# Patient Record
Sex: Female | Born: 1935 | Race: White | Hispanic: No | Smoking: Never smoker
Health system: Southern US, Community
[De-identification: ages and names within clinical notes are randomized; demographics above are authoritative.]

## PROBLEM LIST (undated history)

## (undated) DIAGNOSIS — N189 Chronic kidney disease, unspecified: Secondary | ICD-10-CM

## (undated) DIAGNOSIS — I10 Essential (primary) hypertension: Secondary | ICD-10-CM

## (undated) DIAGNOSIS — R0602 Shortness of breath: Secondary | ICD-10-CM

## (undated) DIAGNOSIS — K469 Unspecified abdominal hernia without obstruction or gangrene: Secondary | ICD-10-CM

## (undated) DIAGNOSIS — I4891 Unspecified atrial fibrillation: Secondary | ICD-10-CM

## (undated) DIAGNOSIS — F0390 Unspecified dementia without behavioral disturbance: Secondary | ICD-10-CM

## (undated) HISTORY — PX: HERNIA REPAIR: SHX51

## (undated) HISTORY — PX: APPENDECTOMY: SHX54

## (undated) HISTORY — PX: TUBAL LIGATION: SHX77

---

## 1998-09-21 ENCOUNTER — Ambulatory Visit (HOSPITAL_COMMUNITY): Admission: RE | Admit: 1998-09-21 | Discharge: 1998-09-21 | Payer: Self-pay | Admitting: Cardiology

## 2006-08-24 ENCOUNTER — Emergency Department (HOSPITAL_COMMUNITY): Admission: EM | Admit: 2006-08-24 | Discharge: 2006-08-24 | Payer: Self-pay | Admitting: Emergency Medicine

## 2012-06-09 ENCOUNTER — Encounter (HOSPITAL_COMMUNITY): Payer: Self-pay | Admitting: Emergency Medicine

## 2012-06-09 ENCOUNTER — Emergency Department (HOSPITAL_COMMUNITY): Payer: Medicare PPO

## 2012-06-09 ENCOUNTER — Emergency Department (HOSPITAL_COMMUNITY)
Admission: EM | Admit: 2012-06-09 | Discharge: 2012-06-09 | Disposition: A | Discharge status: Home / Self Care | Payer: Medicare PPO | Attending: Emergency Medicine | Admitting: Emergency Medicine

## 2012-06-09 DIAGNOSIS — N390 Urinary tract infection, site not specified: Secondary | ICD-10-CM | POA: Insufficient documentation

## 2012-06-09 DIAGNOSIS — R51 Headache: Secondary | ICD-10-CM | POA: Insufficient documentation

## 2012-06-09 DIAGNOSIS — Z7982 Long term (current) use of aspirin: Secondary | ICD-10-CM | POA: Insufficient documentation

## 2012-06-09 DIAGNOSIS — R42 Dizziness and giddiness: Secondary | ICD-10-CM

## 2012-06-09 LAB — URINALYSIS, ROUTINE W REFLEX MICROSCOPIC
Bilirubin Urine: NEGATIVE
Glucose, UA: NEGATIVE mg/dL
Hgb urine dipstick: NEGATIVE
Ketones, ur: NEGATIVE mg/dL
Leukocytes, UA: NEGATIVE
Nitrite: POSITIVE — AB
Protein, ur: NEGATIVE mg/dL
Specific Gravity, Urine: >1.03 — ABNORMAL HIGH (ref 1.005–1.030)
Urobilinogen, UA: 0.2 mg/dL (ref 0.0–1.0)
pH: 5.5 (ref 5.0–8.0)

## 2012-06-09 LAB — COMPREHENSIVE METABOLIC PANEL WITH GFR
ALT: 13 U/L (ref 0–35)
AST: 22 U/L (ref 0–37)
Albumin: 4 g/dL (ref 3.5–5.2)
Alkaline Phosphatase: 118 U/L — ABNORMAL HIGH (ref 39–117)
BUN: 18 mg/dL (ref 6–23)
CO2: 25 meq/L (ref 19–32)
Calcium: 9.4 mg/dL (ref 8.4–10.5)
Chloride: 101 meq/L (ref 96–112)
Creatinine, Ser: 0.98 mg/dL (ref 0.50–1.10)
GFR calc Af Amer: 60 mL/min — ABNORMAL LOW (ref >90)
GFR calc non Af Amer: 52 mL/min — ABNORMAL LOW (ref >90)
Glucose, Bld: 123 mg/dL — ABNORMAL HIGH (ref 70–99)
Potassium: 3.7 meq/L (ref 3.5–5.1)
Sodium: 138 meq/L (ref 135–145)
Total Bilirubin: 0.4 mg/dL (ref 0.3–1.2)
Total Protein: 7.9 g/dL (ref 6.0–8.3)

## 2012-06-09 LAB — CBC WITH DIFFERENTIAL/PLATELET
Eosinophils Absolute: 0.1 10*3/uL (ref 0.0–0.7)
Eosinophils Relative: 2 % (ref 0–5)
Hemoglobin: 15.5 g/dL — ABNORMAL HIGH (ref 12.0–15.0)
Lymphs Abs: 0.7 10*3/uL (ref 0.7–4.0)
MCH: 31.4 pg (ref 26.0–34.0)
MCV: 94.3 fL (ref 78.0–100.0)
Monocytes Relative: 6 % (ref 3–12)
RBC: 4.94 MIL/uL (ref 3.87–5.11)

## 2012-06-09 MED ORDER — NITROFURANTOIN MACROCRYSTAL 100 MG PO CAPS
100.0000 mg | ORAL_CAPSULE | Freq: Once | ORAL | Status: AC
  Administered 2012-06-09: 100 mg via ORAL
  Filled 2012-06-09: qty 1

## 2012-06-09 MED ORDER — NITROFURANTOIN MONOHYD MACRO 100 MG PO CAPS: 100.0000 mg | ORAL_CAPSULE | Freq: Two times a day (BID) | ORAL | Status: DC

## 2012-06-09 MED ORDER — MECLIZINE HCL 50 MG PO TABS: 25.0000 mg | ORAL_TABLET | Freq: Two times a day (BID) | ORAL | Status: DC | PRN

## 2012-06-09 NOTE — ED Notes (Signed)
Patient does not need anything at this time. 

## 2012-06-09 NOTE — ED Notes (Signed)
Pt reports having cough and congestion for several days, has been dizzy today. Lives w/ her son, stated she is never sick and does not have a pmd. Denies any headache or cp. No nausea or vomiting, normal po intake. resp even and unlabored. Denies any urinary s/s

## 2012-06-09 NOTE — ED Notes (Signed)
States that she started feeling dizzy this morning, states that she had a headache yesterday.

## 2012-06-09 NOTE — ED Provider Notes (Addendum)
History     CSN: [id number]  Arrival date & time 06/09/12  1227   First MD Initiated Contact with Patient 06/09/12 1239      Chief Complaint  Patient presents with  . Dizziness    (Consider location/radiation/quality/duration/timing/severity/associated sxs/prior treatment) HPI....Marland Kitchensense of dizziness this morning with associated headache. Symptoms have since abated. No motor or sensory deficits. No difficulty with speech. Patient is concerned that she might have a urinary tract infection. No evidence of an upper respiratory infection. Severity was mild to moderate. No chest pain, dyspnea, fever, chills, dysuria  History reviewed. No pertinent past medical history.  History reviewed. No pertinent past surgical history.  No family history on file.  History  Substance Use Topics  . Smoking status: Not on file  . Smokeless tobacco: Not on file  . Alcohol Use: Not on file    OB History   Grav Para Term Preterm Abortions TAB SAB Ect Mult Living                  Review of Systems  All other systems reviewed and are negative.    Allergies  Penicillins  Home Medications   Current Outpatient Rx  Name  Route  Sig  Dispense  Refill  . aspirin EC 81 MG tablet   Oral   Take 81 mg by mouth daily.         . bimatoprost (LUMIGAN) 0.01 % SOLN   Both Eyes   Place 1 drop into both eyes at bedtime.         . meclizine (ANTIVERT) 50 MG tablet   Oral   Take 0.5 tablets (25 mg total) by mouth 2 (two) times daily as needed for dizziness.   20 tablet   0   . nitrofurantoin, macrocrystal-monohydrate, (MACROBID) 100 MG capsule   Oral   Take 1 capsule (100 mg total) by mouth 2 (two) times daily. X 7 days   14 capsule   0     BP 147/61  Pulse 67  Temp(Src) 97.8 F (36.6 C) (Oral)  Resp 18  Ht 5\' 2"  (1.575 m)  Wt 160 lb (72.576 kg)  BMI 29.26 kg/m2  SpO2 98%  Physical Exam  Nursing note and vitals reviewed. Constitutional: She is oriented to person, place, and  time. She appears well-developed and well-nourished.  Hard of hearing  HENT:  Head: Normocephalic and atraumatic.  Eyes: Conjunctivae and EOM are normal. Pupils are equal, round, and reactive to light.  Neck: Normal range of motion. Neck supple.  Cardiovascular: Normal rate, regular rhythm and normal heart sounds.   Pulmonary/Chest: Effort normal and breath sounds normal.  Abdominal:  Large ventral hernia which is old  Musculoskeletal: Normal range of motion.  Neurological: She is alert and oriented to person, place, and time.  Skin: Skin is warm and dry.  Psychiatric: She has a normal mood and affect.    ED Course  Procedures (including critical care time)  Labs Reviewed  URINALYSIS, ROUTINE W REFLEX MICROSCOPIC - Abnormal; Notable for the following:    Specific Gravity, Urine >1.030 (*)    Nitrite POSITIVE (*)    All other components within normal limits  COMPREHENSIVE METABOLIC PANEL - Abnormal; Notable for the following:    Glucose, Bld 123 (*)    Alkaline Phosphatase 118 (*)    GFR calc non Af Amer 52 (*)    GFR calc Af Amer 60 (*)    All other components within normal limits  CBC  WITH DIFFERENTIAL - Abnormal; Notable for the following:    Hemoglobin 15.5 (*)    HCT 46.6 (*)    Neutrophils Relative 82 (*)    Lymphocytes Relative 10 (*)    All other components within normal limits  URINE MICROSCOPIC-ADD ON - Abnormal; Notable for the following:    Squamous Epithelial / LPF FEW (*)    Bacteria, UA MANY (*)    All other components within normal limits  URINE CULTURE   Dg Chest 2 View  06/09/2012  *RADIOLOGY REPORT*  Clinical Data: Cough, congestion and dizziness.  CHEST - 2 VIEW  Comparison: 08/24/2006  Findings: Probable component of chronic lung disease/COPD which shows some progression since the prior chest x-ray in 2008. Bronchial thickening and prominence is noted in both lower lung zones.  It would be difficult to exclude early pneumonia.  No edema or pleural fluid  is identified.  The heart is mildly enlarged.  The bony thorax is unremarkable.  IMPRESSION: Some progression of probable chronic lung disease since 2008. Bronchial thickening and prominence may be chronic in both lower lung zones.  It is would be difficult to exclude early pneumonia in either lower lung given current x-ray appearance.   Original Report Authenticated By: Irish Lack, M.D.      1. Vertigo   2. Urinary tract infection       MDM  Patient is feeling much better now. No neuro deficits. Urinalysis shows evidence of infection. Rx Macrobid for one week. Discussed with patient and family. Also Rx for meclizine for vertigo        Donnetta Hutching, MD 06/09/12 1510  Donnetta Hutching, MD 06/11/12 2157

## 2012-06-09 NOTE — ED Notes (Signed)
RN at bedside

## 2012-06-11 LAB — URINE CULTURE: Colony Count: >=100000

## 2012-06-12 ENCOUNTER — Telehealth (HOSPITAL_COMMUNITY): Payer: Self-pay | Admitting: Emergency Medicine

## 2012-06-12 NOTE — ED Notes (Signed)
Patient has +Urine culture. Checking to see if appropriately treated. °

## 2012-06-12 NOTE — ED Notes (Signed)
+  Urine. Patient treated with Macrobid. Sensitive to same. Per protocol MD. °

## 2012-08-21 ENCOUNTER — Other Ambulatory Visit: Payer: Self-pay | Admitting: Ophthalmology

## 2012-08-21 MED ORDER — TETRACAINE HCL 0.5 % OP SOLN: 1.0000 [drp] | OPHTHALMIC | Status: DC

## 2012-08-21 NOTE — H&P (Signed)
History & Physical:   DATE:   08-08-12  NAME:  Tracy [name], Tracy [name]     [id number]       HISTORY OF PRESENT ILLNESS: Chief Eye Complaints glaucoma    Patient states that vision is getting worse interfering with reading & daily activities .  HPI: EYES: Reports symptoms of blurry vision     LOCATION:   right eye   QUALITY/COURSE:   Reports condition is worse   INTENSITY/SEVERITY:    Reports measurement ( or degree) as severe   DURATION:   Reports the general length of symptoms to be months    ONSET/TIMING:   Reports occurrence as constant   CONTEXT/WHEN:   Reports usually associated with daily activites             ROS:   GEN- Constitutional: Negative general-constitutional systems review.      HENT: GEN - Endocrine: Reports symptoms of LUNGS/Respiratory:  HEART/Cardiovascular: Reports symptoms of ABD/Gastrointestinal:   Musculoskeletal (BJE): NEURO/Neurological: PSYCH/Psychiatric:    Is the pt oriented to time, place, person? yes Mood depressed __ normal  agitated __  ACTIVE PROBLEMS: chalazion  OD Primary open angle glaucoma   ICD#365.11  stable Nuclear cataract NOS   ICD#366.04  visually significant Macular degeneration   ICD#362.50  SURGERIES: Pick List - Surgeries  Hernia Surgery 1968  MEDICATIONS: Lumigan: Strength-  SIG-  QHS OU  TOBACCO: Never smoker   ICD#V13.89   none/nothing  SOCIAL HISTORY: Widow.   Retired  FAMILY HISTORY: noncontributory  ALLERGIES PENICILLIN:  PHYSICAL EXAMINATION: [place]     OD:cc 20/200 PH 20/100 OS:cc 20/NLP  EYEGLASSES:  OD:+1.25+0.50x104 OS:+1.25+0.50x081 ADD:+2.50  MR  05/10/2012 13:02 Best corrected OD: +1.25 + 0.50 x 104   20/70 OS:  Balance                  NLP ADD  Auto Refraction 08/08/2012 12:34  OD:PL. +0.75x081 OS:PL. +075 x046  K's Reading:08/08/2012 12:34   OD.46.25 47.00 46.75 OS:45.50 46.25 46.00  VF: OD constricted OS  blind   PUPILS:4 mm normal reaction +2 Marcus Gunn pupil  OS  EYELIDS & OCULAR ADNEXA  RUL nodule  deep set orbit each eye   SLE: Conjunctiva: trace to +1 injection Each eye   Cornea: arc arcus each eye with decreased tear film withu   Anterior Chamber:   deep and quiet Iris: With with with with with gray  Lens: with +3-4 nuclear sclerosis each eye   Ta   in mmHg:     OD: 16 OS: 18 Time: 08/08/2012 13:07      Dilation:     Fundus:  optic nerve:   OD:   pink 45% cup OD                                                OS:   temporal pale inferior & superior rim loss   Macula:       OD:  pig mottling  OS: pig   Vessels: narrow OU  Periphery: normal  H&P B/P:130/64 Pulse:68 Resp.20   Exam: GENERAL: Appearance: General appearance can be described as well-nourished, well-developed, and in no acute distress.    LYMPHATIC: HEAD, EARS, NOSE AND THROAT: Ears-Nose (external) Inspection: Externally, nose and ears are normal in appearance and without scars, lesions, or nodules.          Hearing assessment shows no problems with normal conversation.    Nose exam, internally, reveals nasal mucosa, septum and turbinates are unremarkable.         Oropharynx demonstrates oral mucosa, salivary glands, tongue, tonsils, posterior pharynx, hard-soft palates are normal.  EYES: see above  NECK: Neck tissue exam demonstrates no masses, symmetrical, and trachea is midline.      LUNGS and RESPIRATORY: Lung auscultation elicits no wheezing, rhonci, rales or rubs and with equal breath sounds.    Respiratory effort described as breathing is unlabored and chest movement is symmetrical.    HEART (Cardiovascular): Heart auscultation discovers regular rate and rhythm; no murmur, gallop or rub. Normal heart sounds.    ABDOMEN (Gastrointestinal): Mass/Tenderness Exam: Neither are present.     Liver/Spleen: No hepatomegaly or splenomegaly.   MUSCULOSKELETAL (BJE): Inspection-Palpation: No major bone,  joint, tendon, or muscle changes.      NEUROLOGICAL: Alert and oriented. No major deficits of coordination or sensation.      PSYCHIATRIC: Insight and judgment appear  both to be intact and appropriate.    Mood and affect are described as normal mood and full affect.    SKIN: Skin Inspection: No rashes or lesions.  ADMITTING DIAGNOSIS:Nuclear cataract NOS   ICD#366.04  visually significant OD chalazion  OD Primary open angle glaucoma   ICD#365.11  stable Blind OS  Macular degeneration   ICD#362.50  SURGICAL TREATMENT PLAN: phaco emulsion cataract extraction  with  intraocular lens implant  OD inject steroid OD upper eyelid now Risk and benefits (expected vision improvement) reviewed with patient & she agrees to proceed with surgery.    Actions:    Actions:    ___________________________ Chalmers Guest, Montez Hageman Starter - Inactive Problems:    Hernia

## 2012-08-23 ENCOUNTER — Encounter (HOSPITAL_COMMUNITY): Payer: Self-pay

## 2012-08-23 ENCOUNTER — Encounter (HOSPITAL_COMMUNITY)
Admission: RE | Admit: 2012-08-23 | Discharge: 2012-08-23 | Disposition: A | Discharge status: Home / Self Care | Payer: Medicare PPO | Source: Ambulatory Visit | Attending: Ophthalmology | Admitting: Ophthalmology

## 2012-08-23 HISTORY — DX: Shortness of breath: R06.02

## 2012-08-23 HISTORY — DX: Chronic kidney disease, unspecified: N18.9

## 2012-08-23 LAB — BASIC METABOLIC PANEL
CO2: 26 mEq/L (ref 19–32)
Calcium: 9.2 mg/dL (ref 8.4–10.5)
Chloride: 103 mEq/L (ref 96–112)
GFR calc Af Amer: 55 mL/min — ABNORMAL LOW (ref >90)
Sodium: 139 mEq/L (ref 135–145)

## 2012-08-23 LAB — CBC
HCT: 44.3 % (ref 36.0–46.0)
MCV: 93.7 fL (ref 78.0–100.0)
Platelets: 394 10*3/uL (ref 150–400)
RBC: 4.73 MIL/uL (ref 3.87–5.11)
WBC: 7.1 10*3/uL (ref 4.0–10.5)

## 2012-08-23 NOTE — Pre-Procedure Instructions (Signed)
Tracy [name]  08/23/2012   Your procedure is scheduled on:  Wednesday Aug 28, 2012   Report to [facility] at [phone] AM.  Call this number if you have problems the morning of surgery: [phone]   Remember:   Do not eat food or drink liquids after midnight.   Take these medicines the morning of surgery with A SIP OF WATER: Meclizine(Antivert) if needed   Do not wear jewelry, make-up or nail polish.  Do not wear lotions, powders, or perfumes. You may wear deodorant.  Do not shave 48 hours prior to surgery.   Do not bring valuables to the hospital.  Contacts, dentures or bridgework may not be worn into surgery.  Leave suitcase in the car. After surgery it may be brought to your room.  For patients admitted to the hospital, checkout time is 11:00 AM the day of  discharge.   Patients discharged the day of surgery will not be allowed to drive  home.  Name and phone number of your driver:   Special Instructions: Shower using CHG 2 nights before surgery and the night before surgery.  If you shower the day of surgery use CHG.  Use special wash - you have one bottle of CHG for all showers.  You should use approximately 1/3 of the bottle for each shower.   Please read over the following fact sheets that you were given: Pain Booklet, Coughing and Deep Breathing and Surgical Site Infection Prevention

## 2012-08-28 ENCOUNTER — Encounter (HOSPITAL_COMMUNITY): Payer: Self-pay | Admitting: Certified Registered"

## 2012-08-28 ENCOUNTER — Ambulatory Visit (HOSPITAL_COMMUNITY): Payer: Medicare PPO | Admitting: Certified Registered"

## 2012-08-28 ENCOUNTER — Ambulatory Visit (HOSPITAL_COMMUNITY)
Admission: RE | Admit: 2012-08-28 | Discharge: 2012-08-28 | Disposition: A | Discharge status: Home / Self Care | Payer: Medicare PPO | Source: Ambulatory Visit | Attending: Ophthalmology | Admitting: Ophthalmology

## 2012-08-28 ENCOUNTER — Encounter (HOSPITAL_COMMUNITY)
Admission: RE | Disposition: A | Discharge status: Home / Self Care | Payer: Self-pay | Source: Ambulatory Visit | Attending: Ophthalmology

## 2012-08-28 DIAGNOSIS — H409 Unspecified glaucoma: Secondary | ICD-10-CM | POA: Insufficient documentation

## 2012-08-28 DIAGNOSIS — H2510 Age-related nuclear cataract, unspecified eye: Secondary | ICD-10-CM | POA: Insufficient documentation

## 2012-08-28 DIAGNOSIS — H4011X0 Primary open-angle glaucoma, stage unspecified: Secondary | ICD-10-CM | POA: Insufficient documentation

## 2012-08-28 DIAGNOSIS — H3530 Unspecified macular degeneration: Secondary | ICD-10-CM | POA: Insufficient documentation

## 2012-08-28 HISTORY — PX: CATARACT EXTRACTION W/PHACO: SHX586

## 2012-08-28 SURGERY — PHACOEMULSIFICATION, CATARACT, WITH IOL INSERTION
Anesthesia: Monitor Anesthesia Care | Site: Eye | Laterality: Right | Wound class: Clean

## 2012-08-28 MED ORDER — GATIFLOXACIN 0.5 % OP SOLN
1.0000 [drp] | OPHTHALMIC | Status: AC
  Administered 2012-08-28 (×3): 1 [drp] via OPHTHALMIC
  Filled 2012-08-28: qty 2.5

## 2012-08-28 MED ORDER — ACETYLCHOLINE CHLORIDE 1:100 IO SOLR
INTRAOCULAR | Status: AC
  Filled 2012-08-28: qty 1

## 2012-08-28 MED ORDER — LIDOCAINE HCL 2 % IJ SOLN
INTRAMUSCULAR | Status: DC | PRN
  Administered 2012-08-28: 14:00:00 via RETROBULBAR

## 2012-08-28 MED ORDER — LIDOCAINE HCL 2 % IJ SOLN
INTRAMUSCULAR | Status: AC
  Filled 2012-08-28: qty 20

## 2012-08-28 MED ORDER — DEXAMETHASONE SODIUM PHOSPHATE 10 MG/ML IJ SOLN
INTRAMUSCULAR | Status: AC
  Filled 2012-08-28: qty 1

## 2012-08-28 MED ORDER — TROPICAMIDE 1 % OP SOLN
1.0000 [drp] | OPHTHALMIC | Status: AC
  Administered 2012-08-28 (×3): 1 [drp] via OPHTHALMIC
  Filled 2012-08-28: qty 3

## 2012-08-28 MED ORDER — HYALURONIDASE HUMAN 150 UNIT/ML IJ SOLN
INTRAMUSCULAR | Status: AC
  Filled 2012-08-28: qty 1

## 2012-08-28 MED ORDER — GENTAMICIN SULFATE 40 MG/ML IJ SOLN
INTRAMUSCULAR | Status: AC
  Filled 2012-08-28: qty 2

## 2012-08-28 MED ORDER — SODIUM HYALURONATE 10 MG/ML IO SOLN
INTRAOCULAR | Status: DC | PRN
  Administered 2012-08-28: 0.85 mL via INTRAOCULAR

## 2012-08-28 MED ORDER — ACETYLCHOLINE CHLORIDE 1:100 IO SOLR
INTRAOCULAR | Status: DC | PRN
  Administered 2012-08-28: 20 mg via INTRAOCULAR

## 2012-08-28 MED ORDER — CYCLOPENTOLATE HCL 1 % OP SOLN
1.0000 [drp] | OPHTHALMIC | Status: AC
  Administered 2012-08-28 (×3): 1 [drp] via OPHTHALMIC
  Filled 2012-08-28: qty 2

## 2012-08-28 MED ORDER — TETRACAINE HCL 0.5 % OP SOLN
OPHTHALMIC | Status: DC | PRN
  Administered 2012-08-28: 2 [drp] via OPHTHALMIC

## 2012-08-28 MED ORDER — FENTANYL CITRATE 0.05 MG/ML IJ SOLN: 25.0000 ug | INTRAMUSCULAR | Status: DC | PRN

## 2012-08-28 MED ORDER — SODIUM CHLORIDE 0.9 % IV SOLN
INTRAVENOUS | Status: DC
  Administered 2012-08-28: 12:00:00 via INTRAVENOUS

## 2012-08-28 MED ORDER — LIDOCAINE-EPINEPHRINE 2 %-1:100000 IJ SOLN
INTRAMUSCULAR | Status: AC
  Filled 2012-08-28: qty 1

## 2012-08-28 MED ORDER — TETRACAINE HCL 0.5 % OP SOLN
OPHTHALMIC | Status: AC
  Filled 2012-08-28: qty 2

## 2012-08-28 MED ORDER — OXYCODONE HCL 5 MG/5ML PO SOLN: 5.0000 mg | Freq: Once | ORAL | Status: DC | PRN

## 2012-08-28 MED ORDER — EPINEPHRINE HCL 1 MG/ML IJ SOLN
INTRAOCULAR | Status: DC | PRN
  Administered 2012-08-28: 13:00:00

## 2012-08-28 MED ORDER — PHENYLEPHRINE HCL 2.5 % OP SOLN: 1.0000 [drp] | OPHTHALMIC | Status: DC

## 2012-08-28 MED ORDER — TOBRAMYCIN-DEXAMETHASONE 0.3-0.1 % OP OINT
TOPICAL_OINTMENT | OPHTHALMIC | Status: AC
  Filled 2012-08-28: qty 3.5

## 2012-08-28 MED ORDER — LIDOCAINE HCL (PF) 1 % IJ SOLN
INTRAMUSCULAR | Status: DC | PRN
  Administered 2012-08-28: 5 mL

## 2012-08-28 MED ORDER — PHENYLEPHRINE HCL 2.5 % OP SOLN
1.0000 [drp] | OPHTHALMIC | Status: AC
  Administered 2012-08-28 (×3): 1 [drp] via OPHTHALMIC
  Filled 2012-08-28: qty 3

## 2012-08-28 MED ORDER — KETOROLAC TROMETHAMINE 0.5 % OP SOLN
1.0000 [drp] | OPHTHALMIC | Status: AC
  Administered 2012-08-28 (×3): 1 [drp] via OPHTHALMIC
  Filled 2012-08-28: qty 5

## 2012-08-28 MED ORDER — KETOROLAC TROMETHAMINE 0.5 % OP SOLN: 1.0000 [drp] | OPHTHALMIC | Status: DC

## 2012-08-28 MED ORDER — EPINEPHRINE HCL 1 MG/ML IJ SOLN
INTRAMUSCULAR | Status: AC
  Filled 2012-08-28: qty 1

## 2012-08-28 MED ORDER — PROPOFOL 10 MG/ML IV BOLUS
INTRAVENOUS | Status: DC | PRN
  Administered 2012-08-28: 20 mg via INTRAVENOUS
  Administered 2012-08-28 (×4): 10 mg via INTRAVENOUS
  Administered 2012-08-28: 20 mg via INTRAVENOUS
  Administered 2012-08-28 (×2): 10 mg via INTRAVENOUS
  Administered 2012-08-28 (×2): 20 mg via INTRAVENOUS

## 2012-08-28 MED ORDER — BSS IO SOLN
INTRAOCULAR | Status: AC
  Filled 2012-08-28: qty 500

## 2012-08-28 MED ORDER — BSS IO SOLN
INTRAOCULAR | Status: DC | PRN
  Administered 2012-08-28: 15 mL via INTRAOCULAR

## 2012-08-28 MED ORDER — PILOCARPINE HCL 4 % OP SOLN
OPHTHALMIC | Status: AC
  Filled 2012-08-28: qty 15

## 2012-08-28 MED ORDER — TOBRAMYCIN 0.3 % OP OINT
TOPICAL_OINTMENT | OPHTHALMIC | Status: DC | PRN
  Administered 2012-08-28: 1 via OPHTHALMIC

## 2012-08-28 MED ORDER — CYCLOPENTOLATE HCL 1 % OP SOLN: 1.0000 [drp] | OPHTHALMIC | Status: DC

## 2012-08-28 MED ORDER — BUPIVACAINE HCL (PF) 0.75 % IJ SOLN
INTRAMUSCULAR | Status: AC
  Filled 2012-08-28: qty 10

## 2012-08-28 MED ORDER — NA CHONDROIT SULF-NA HYALURON 40-30 MG/ML IO SOLN
INTRAOCULAR | Status: DC | PRN
  Administered 2012-08-28: 0.5 mL via INTRAOCULAR

## 2012-08-28 MED ORDER — GATIFLOXACIN 0.5 % OP SOLN: 1.0000 [drp] | OPHTHALMIC | Status: DC

## 2012-08-28 MED ORDER — OXYCODONE HCL 5 MG PO TABS: 5.0000 mg | ORAL_TABLET | Freq: Once | ORAL | Status: DC | PRN

## 2012-08-28 MED ORDER — NA CHONDROIT SULF-NA HYALURON 40-30 MG/ML IO SOLN
INTRAOCULAR | Status: AC
  Filled 2012-08-28: qty 0.5

## 2012-08-28 MED ORDER — SODIUM HYALURONATE 10 MG/ML IO SOLN
INTRAOCULAR | Status: AC
  Filled 2012-08-28: qty 0.85

## 2012-08-28 MED ORDER — LIDOCAINE HCL (CARDIAC) 20 MG/ML IV SOLN
INTRAVENOUS | Status: DC | PRN
  Administered 2012-08-28: 40 mg via INTRAVENOUS

## 2012-08-28 SURGICAL SUPPLY — 39 items
APPLICATOR COTTON TIP 6IN STRL (MISCELLANEOUS) ×4 IMPLANT
APPLICATOR DR MATTHEWS STRL (MISCELLANEOUS) ×2 IMPLANT
BAG MINI COLL DRAIN (WOUND CARE) ×2 IMPLANT
BLADE KERATOME 2.75 (BLADE) ×2 IMPLANT
BLADE MINI RND TIP GREEN BEAV (BLADE) IMPLANT
BLADE STAB KNIFE 45DEG (BLADE) IMPLANT
CANNULA ANTERIOR CHAMBER 27GA (MISCELLANEOUS) ×2 IMPLANT
CLOTH BEACON ORANGE TIMEOUT ST (SAFETY) ×2 IMPLANT
CORDS BIPOLAR (ELECTRODE) IMPLANT
DRAPE OPHTHALMIC 40X48 W POUCH (DRAPES) ×2 IMPLANT
DRAPE RETRACTOR (MISCELLANEOUS) ×2 IMPLANT
FILTER BLUE MILLIPORE (MISCELLANEOUS) IMPLANT
GLOVE BIO SURGEON STRL SZ8 (GLOVE) ×2 IMPLANT
GOWN STRL NON-REIN LRG LVL3 (GOWN DISPOSABLE) ×4 IMPLANT
KIT BASIN OR (CUSTOM PROCEDURE TRAY) ×2 IMPLANT
KIT ROOM TURNOVER OR (KITS) ×2 IMPLANT
KNIFE CRESCENT 2.5 55 ANG (BLADE) IMPLANT
LENS IOL ACRSF IQ PC 24.0 (Intraocular Lens) ×1 IMPLANT
LENS IOL ACRYSOF IQ POST 24.0 (Intraocular Lens) ×2 IMPLANT
MASK EYE SHIELD (GAUZE/BANDAGES/DRESSINGS) ×2 IMPLANT
NEEDLE 18GX1X1/2 (RX/OR ONLY) (NEEDLE) ×2 IMPLANT
NEEDLE 25GX 5/8IN NON SAFETY (NEEDLE) ×2 IMPLANT
NEEDLE FILTER BLUNT 18X 1/2SAF (NEEDLE) ×1
NEEDLE FILTER BLUNT 18X1 1/2 (NEEDLE) ×1 IMPLANT
NS IRRIG 1000ML POUR BTL (IV SOLUTION) ×2 IMPLANT
PACK CATARACT CUSTOM (CUSTOM PROCEDURE TRAY) ×2 IMPLANT
PACK CATARACT MCHSCP (PACKS) ×2 IMPLANT
PAD ARMBOARD 7.5X6 YLW CONV (MISCELLANEOUS) ×4 IMPLANT
PAD EYE OVAL STERILE LF (GAUZE/BANDAGES/DRESSINGS) ×2 IMPLANT
PROBE ANTERIOR 20G W/INFUS NDL (MISCELLANEOUS) IMPLANT
SPEAR EYE SURG WECK-CEL (MISCELLANEOUS) IMPLANT
SUT ETHILON 10 0 CS140 6 (SUTURE) ×2 IMPLANT
SUT SILK 4 0 C 3 735G (SUTURE) IMPLANT
SUT SILK 6 0 G 6 (SUTURE) IMPLANT
SUT VICRYL 8 0 TG140 8 (SUTURE) IMPLANT
SYR 3ML LL SCALE MARK (SYRINGE) IMPLANT
SYR TB 1ML LUER SLIP (SYRINGE) ×2 IMPLANT
TOWEL OR 17X24 6PK STRL BLUE (TOWEL DISPOSABLE) ×4 IMPLANT
WATER STERILE IRR 1000ML POUR (IV SOLUTION) ×2 IMPLANT

## 2012-08-28 NOTE — Transfer of Care (Signed)
Immediate Anesthesia Transfer of Care Note  Patient: Tracy [name]  Procedure(s) Performed: Procedure(s): CATARACT EXTRACTION PHACO AND INTRAOCULAR LENS PLACEMENT (IOC) (Right)  Patient Location: Short Stay  Anesthesia Type:MAC  Level of Consciousness: awake, alert  and oriented  Airway & Oxygen Therapy: Patient Spontanous Breathing  Post-op Assessment: Report given to PACU RN and Patient moving all extremities X 4  Post vital signs: Reviewed and stable  Complications: No apparent anesthesia complications

## 2012-08-28 NOTE — Anesthesia Preprocedure Evaluation (Signed)
Anesthesia Evaluation  Patient identified by MRN, date of birth, ID band Patient awake    Reviewed: Allergy & Precautions, H&P , NPO status , Patient's Chart, lab work & pertinent test results  History of Anesthesia Complications Negative for: history of anesthetic complications  Airway Mallampati: II      Dental  (+) Missing, Poor Dentition and Dental Advisory Given   Pulmonary shortness of breath,  breath sounds clear to auscultation        Cardiovascular negative cardio ROS  Rhythm:Regular Rate:Normal + Systolic murmurs    Neuro/Psych Hearing issues    GI/Hepatic negative GI ROS, Neg liver ROS,   Endo/Other  negative endocrine ROS  Renal/GU Renal InsufficiencyRenal disease     Musculoskeletal negative musculoskeletal ROS (+)   Abdominal   Peds  Hematology negative hematology ROS (+)   Anesthesia Other Findings   Reproductive/Obstetrics                           Anesthesia Physical Anesthesia Plan  ASA: II  Anesthesia Plan: MAC   Post-op Pain Management:    Induction: Intravenous  Airway Management Planned: Natural Airway  Additional Equipment:   Intra-op Plan:   Post-operative Plan: Extubation in OR  Informed Consent:   Dental advisory given  Plan Discussed with: CRNA and Surgeon  Anesthesia Plan Comments:         Anesthesia Quick Evaluation

## 2012-08-28 NOTE — H&P (View-Only) (Signed)
                  History & Physical:   DATE:   08-08-12  NAME:  Tracy [name]     [id number]       HISTORY OF PRESENT ILLNESS: Chief Eye Complaints glaucoma    Patient states that vision is getting worse interfering with reading & daily activities .  HPI: EYES: Reports symptoms of blurry vision     LOCATION:   right eye   QUALITY/COURSE:   Reports condition is worse   INTENSITY/SEVERITY:    Reports measurement ( or degree) as severe   DURATION:   Reports the general length of symptoms to be months    ONSET/TIMING:   Reports occurrence as constant   CONTEXT/WHEN:   Reports usually associated with daily activites             ROS:   GEN- Constitutional: Negative general-constitutional systems review.      HENT: GEN - Endocrine: Reports symptoms of LUNGS/Respiratory:  HEART/Cardiovascular: Reports symptoms of ABD/Gastrointestinal:   Musculoskeletal (BJE): NEURO/Neurological: PSYCH/Psychiatric:    Is the pt oriented to time, place, person? yes Mood depressed __ normal  agitated __  ACTIVE PROBLEMS: chalazion  OD Primary open angle glaucoma   ICD#365.11  stable Nuclear cataract NOS   ICD#366.04  visually significant Macular degeneration   ICD#362.50  SURGERIES: Pick List - Surgeries  Hernia Surgery 1966  MEDICATIONS: Lumigan: Strength-  SIG-  QHS OU  TOBACCO: Never smoker   ICD#V13.89   none/nothing  SOCIAL HISTORY: Widow.   Retired  FAMILY HISTORY: noncontributory  ALLERGIES PENICILLIN:  [place]ICAL [place]AMIN[place]: [place]     OD:cc 20/200 PH 20/100 OS:cc 20/NLP  EYEGLASSES:  OD:+1.25+0.50x104 OS:+1.25+0.50x081 ADD:+2.50  MR  05/10/2012 13:02 Best corrected OD: +1.25 + 0.50 x 104   20/70 OS:  Balance                  NLP ADD  Auto Refraction 08/08/2012 12:34  OD:PL. +0.75x081 OS:PL. +075 x046  K's Reading:08/08/2012 12:34   OD.46.25 47.00 46.75 OS:45.50 46.25 46.00  VF: OD constricted OS  blind   PUPILS:4 mm normal reaction +2 Marcus Gunn pupil  OS  EYELIDS & OCULAR ADNEXA  RUL nodule  deep set orbit each eye   SLE: Conjunctiva: trace to +1 injection Each eye   Cornea: arc arcus each eye with decreased tear film withu   Anterior Chamber:   deep and quiet Iris: With with with with with gray  Lens: with +3-4 nuclear sclerosis each eye   Ta   in mmHg:     OD: 16 OS: 18 Time: 08/08/2012 13:07      Dilation:     Fundus:  optic nerve:   OD:   pink 45% cup OD                                                OS:   temporal pale inferior & superior rim loss   Macula:       OD:  pig mottling                                                      OS: pig   Vessels: narrow OU  Periphery: normal  H&P B/P:130/64 Pulse:68 Resp.20   Exam: GENERAL: Appearance: General appearance can be described as well-nourished, well-developed, and in no acute distress.    LYMPHATIC: HEAD, EARS, NOSE AND THROAT: Ears-Nose (external) Inspection: Externally, nose and ears are normal in appearance and without scars, lesions, or nodules.          Hearing assessment shows no problems with normal conversation.    Nose exam, internally, reveals nasal mucosa, septum and turbinates are unremarkable.         Oropharynx demonstrates oral mucosa, salivary glands, tongue, tonsils, posterior pharynx, hard-soft palates are normal.  EYES: see above  NECK: Neck tissue exam demonstrates no masses, symmetrical, and trachea is midline.      LUNGS and RESPIRATORY: Lung auscultation elicits no wheezing, rhonci, rales or rubs and with equal breath sounds.    Respiratory effort described as breathing is unlabored and chest movement is symmetrical.    HEART (Cardiovascular): Heart auscultation discovers regular rate and rhythm; no murmur, gallop or rub. Normal heart sounds.    ABDOMEN (Gastrointestinal): Mass/Tenderness Exam: Neither are present.     Liver/Spleen: No hepatomegaly or splenomegaly.   MUSCULOSKELETAL (BJE): Inspection-Palpation: No major bone,  joint, tendon, or muscle changes.      NEUROLOGICAL: Alert and oriented. No major deficits of coordination or sensation.      PSYCHIATRIC: Insight and judgment appear  both to be intact and appropriate.    Mood and affect are described as normal mood and full affect.    SKIN: Skin Inspection: No rashes or lesions.  ADMITTING DIAGNOSIS:Nuclear cataract NOS   ICD#366.04  visually significant OD chalazion  OD Primary open angle glaucoma   ICD#365.11  stable Blind OS  Macular degeneration   ICD#362.50  SURGICAL TREATMENT PLAN: phaco emulsion cataract extraction  with  intraocular lens implant  OD inject steroid OD upper eyelid now Risk and benefits (expected vision improvement) reviewed with patient & she agrees to proceed with surgery.    Actions:    Actions:    ___________________________ Jenevieve Kirschbaum, Jr. Starter - Inactive Problems:    Hernia  

## 2012-08-28 NOTE — Preoperative (Signed)
Beta Blockers   Reason not to administer Beta Blockers:Not Applicable 

## 2012-08-28 NOTE — Anesthesia Postprocedure Evaluation (Signed)
  Anesthesia Post-op Note  Patient: Tracy [name]  Procedure(s) Performed: Procedure(s): CATARACT EXTRACTION PHACO AND INTRAOCULAR LENS PLACEMENT (IOC) (Right)  Patient Location: Short Stay  Anesthesia Type:MAC  Level of Consciousness: awake, alert  and oriented  Airway and Oxygen Therapy: Patient Spontanous Breathing  Post-op Pain: none  Post-op Assessment: Post-op Vital signs reviewed  Post-op Vital Signs: Reviewed and stable  Complications: No apparent anesthesia complications

## 2012-08-28 NOTE — Op Note (Signed)
Preoperative diagnosis: Visually significant cataract in patient's only remaining right eye and glaucoma right eye.  Postoperative diagnosis: Same Procedure: Phacoemulsification with intraocular lens implant Complications: None Anesthesia: 2% Xylocaine with Marcaine and tetracaine topical, intracameral 1% Xylocaine preservative-free Topical anesthetics were applied to the patient's eye and the patient's face was prepped and draped in the usual sterile fashion. With the surgeon sitting temporally and the operating microscope in position. Weck-Cel sponges used to fixate the globe it was noted that the orbit was very deep set with a very prominent brow making exposure to the eye very difficult. Using a Weck-Cel sponge a 15 blade was used to enter the anterior chamber and Viscoat was injected in the eye prior to injecting Viscoat 1% Xylocaine preservative-free was also injected into the anterior chamber. Following this an additional Weck-Cel sponges used to fixate the globe and a 2.5 mm keratome blade was used through temporal clear cornea to into the anterior chamber and a stepwise fashion. Additional Viscoat was injected into the eye. Using a bent 25-gauge needle the anterior capsule was incised and a continuous tear curvilinear capsulorrhexis was formed the Utrata forceps was used correction the Utrata forceps were bent not able to be use therefore angle Kellan forceps was used to remove the anterior capsule. Following this BSS was used to hydrodissect and hydrodelineate the nucleus the nucleus was then rotated in the capsular bag. Following this the phacoemulsification unit was then used to sculpt the epinucleus the nucleus was sculpt deeply centrally a Kuglen hook was used to support the nucleus and then snapped the nucleus into 4 quadrants by rotating the nucleus and use and it gets the phacotip. Following this the phaco emulsification unit was then used to remove the 4 nuclear fragments using a pulse mode  after all nuclear fragments had been removed from the eye the posterior capsule remained intact the epinucleus was present. The epinucleus was removed using the irrigation aspiration handpiece. Following this the cortical fibers were stripped from the posterior capsule all cortical fibers were removed except subincisional cortex and the posterior capsule remained intact. At this point Provisc was injected into the eye the intraocular lens implant was examined and noted to have no defects the lens was an Alcon AcrySof SN 60 WF 24.0 diopter lens SN #[id number] the lens is placed in the lens injector the injector was placed through the incision the lens was injected and unfolded in the eye in position with a Kuglen hook following this the irrigation aspiration handpiece was then used to remove subincisional cortex and viscoelastic from the eye. Miostat was injected in the eye the pupil was noted to be round a single 10-0 nylon suture was placed and the suture was buried the eye was pressurized with balanced salt solution and there was no leakage. Therefore all instruments including the speculum were removed from the eye Topical TobraDex ointment was applied to the eye. A plastic shield with holes was placed over the eye since this is the patient's only seeing eye. The patient returned to recovery area in stable condition

## 2012-08-28 NOTE — Interval H&P Note (Signed)
History and Physical Interval Note:  08/28/2012 12:44 PM  Tracy [name]  has presented today for surgery, with the diagnosis of CATARACT  The various methods of treatment have been discussed with the patient and family. After consideration of risks, benefits and other options for treatment, the patient has consented to  Procedure(s): CATARACT EXTRACTION PHACO AND INTRAOCULAR LENS PLACEMENT (IOC) (Right) as a surgical intervention .  The patient's history has been reviewed, patient examined, no change in status, stable for surgery.  I have reviewed the patient's chart and labs.  Questions were answered to the patient's satisfaction.     Shanan Fitzpatrick

## 2012-08-30 ENCOUNTER — Encounter (HOSPITAL_COMMUNITY): Payer: Self-pay | Admitting: Ophthalmology

## 2013-03-25 ENCOUNTER — Encounter (HOSPITAL_COMMUNITY): Payer: Self-pay | Admitting: Emergency Medicine

## 2013-03-25 ENCOUNTER — Emergency Department (HOSPITAL_COMMUNITY): Payer: Medicare PPO

## 2013-03-25 ENCOUNTER — Emergency Department (HOSPITAL_COMMUNITY)
Admission: EM | Admit: 2013-03-25 | Discharge: 2013-03-25 | Disposition: A | Discharge status: Home / Self Care | Payer: Medicare PPO | Attending: Emergency Medicine | Admitting: Emergency Medicine

## 2013-03-25 DIAGNOSIS — J209 Acute bronchitis, unspecified: Secondary | ICD-10-CM | POA: Insufficient documentation

## 2013-03-25 DIAGNOSIS — N189 Chronic kidney disease, unspecified: Secondary | ICD-10-CM | POA: Insufficient documentation

## 2013-03-25 DIAGNOSIS — N390 Urinary tract infection, site not specified: Secondary | ICD-10-CM | POA: Insufficient documentation

## 2013-03-25 DIAGNOSIS — J40 Bronchitis, not specified as acute or chronic: Secondary | ICD-10-CM

## 2013-03-25 DIAGNOSIS — R609 Edema, unspecified: Secondary | ICD-10-CM | POA: Insufficient documentation

## 2013-03-25 DIAGNOSIS — Z880 Allergy status to penicillin: Secondary | ICD-10-CM | POA: Insufficient documentation

## 2013-03-25 DIAGNOSIS — Z7982 Long term (current) use of aspirin: Secondary | ICD-10-CM | POA: Insufficient documentation

## 2013-03-25 LAB — TROPONIN I: Troponin I: <0.3 ng/mL (ref <0.30)

## 2013-03-25 LAB — CBC WITH DIFFERENTIAL/PLATELET
HCT: 48.7 % — ABNORMAL HIGH (ref 36.0–46.0)
Hemoglobin: 16.3 g/dL — ABNORMAL HIGH (ref 12.0–15.0)
Lymphocytes Relative: 7 % — ABNORMAL LOW (ref 12–46)
Lymphs Abs: 0.7 10*3/uL (ref 0.7–4.0)
MCHC: 33.5 g/dL (ref 30.0–36.0)
Monocytes Absolute: 0.9 10*3/uL (ref 0.1–1.0)
Monocytes Relative: 9 % (ref 3–12)
Neutro Abs: 7.8 10*3/uL — ABNORMAL HIGH (ref 1.7–7.7)
RBC: 5.14 MIL/uL — ABNORMAL HIGH (ref 3.87–5.11)
WBC: 9.7 10*3/uL (ref 4.0–10.5)

## 2013-03-25 LAB — COMPREHENSIVE METABOLIC PANEL
Alkaline Phosphatase: 117 U/L (ref 39–117)
BUN: 11 mg/dL (ref 6–23)
CO2: 27 mEq/L (ref 19–32)
Chloride: 98 mEq/L (ref 96–112)
Creatinine, Ser: 0.89 mg/dL (ref 0.50–1.10)
GFR calc non Af Amer: 58 mL/min — ABNORMAL LOW (ref >90)
Glucose, Bld: 100 mg/dL — ABNORMAL HIGH (ref 70–99)
Total Bilirubin: 0.7 mg/dL (ref 0.3–1.2)

## 2013-03-25 LAB — URINALYSIS, ROUTINE W REFLEX MICROSCOPIC
Bilirubin Urine: NEGATIVE
Hgb urine dipstick: NEGATIVE
Nitrite: NEGATIVE
Urobilinogen, UA: 1 mg/dL (ref 0.0–1.0)
pH: 6 (ref 5.0–8.0)

## 2013-03-25 LAB — PRO B NATRIURETIC PEPTIDE: Pro B Natriuretic peptide (BNP): 732.9 pg/mL — ABNORMAL HIGH (ref 0–450)

## 2013-03-25 LAB — URINE MICROSCOPIC-ADD ON

## 2013-03-25 MED ORDER — PREDNISONE 20 MG PO TABS: ORAL_TABLET | ORAL | Status: DC

## 2013-03-25 MED ORDER — ALBUTEROL SULFATE (5 MG/ML) 0.5% IN NEBU
2.5000 mg | INHALATION_SOLUTION | Freq: Once | RESPIRATORY_TRACT | Status: AC
  Administered 2013-03-25: 2.5 mg via RESPIRATORY_TRACT
  Filled 2013-03-25: qty 0.5

## 2013-03-25 MED ORDER — ALBUTEROL SULFATE HFA 108 (90 BASE) MCG/ACT IN AERS
2.0000 | INHALATION_SPRAY | RESPIRATORY_TRACT | Status: DC | PRN
  Filled 2013-03-25: qty 6.7

## 2013-03-25 MED ORDER — ALBUTEROL SULFATE HFA 108 (90 BASE) MCG/ACT IN AERS: 2.0000 | INHALATION_SPRAY | Freq: Once | RESPIRATORY_TRACT | Status: DC

## 2013-03-25 MED ORDER — CEPHALEXIN 500 MG PO CAPS: 500.0000 mg | ORAL_CAPSULE | Freq: Four times a day (QID) | ORAL | Status: DC

## 2013-03-25 NOTE — ED Notes (Signed)
RT notified of breathing treatment.

## 2013-03-25 NOTE — ED Notes (Signed)
This RN is now caring for this patient at this time. Introduced myself to patient and family, and also assessed patient. Patient complaining of nonproductive cough. Patient in NAD. Will continue to monitor patient.

## 2013-03-25 NOTE — ED Notes (Signed)
Pt went to Urgent Care because she thought she had a "cold".  Sent here  To eval for possible chf   CXR was sent with her.  Alert,   Had flank pain in last few days ,No pain now

## 2013-03-25 NOTE — ED Provider Notes (Signed)
CSN: [id number]     Arrival date & time 03/25/13  1418 History   First MD Initiated Contact with Patient 03/25/13 1611     Chief Complaint  Patient presents with  . Shortness of Breath   (Consider location/radiation/quality/duration/timing/severity/associated sxs/prior Treatment) HPI Comments: Patient presents to the ER after being seen at urgent care. Patient reports that she started having a runny nose and cough yesterday. Today she started having some wheezing and difficulty breathing. She was seen at urgent care, had an x-ray and was told to come to the emergency department evaluated for possible congestive heart failure. Patient denies any previous history of congestive heart failure. She denies chest pain.  Patient is a 79 y.o. female presenting with shortness of breath.  Shortness of Breath Associated symptoms: cough and wheezing   Associated symptoms: no chest pain     Past Medical History  Diagnosis Date  . Shortness of breath   . Chronic kidney disease     hx kidney infection   Past Surgical History  Procedure Laterality Date  . Hernia repair      umbilical  . Appendectomy    . Cataract extraction w/phaco Right 08/28/2012    Procedure: CATARACT EXTRACTION PHACO AND INTRAOCULAR LENS PLACEMENT (IOC);  Surgeon: Chalmers Guest, MD;  Location: [facility] OR;  Service: Ophthalmology;  Laterality: Right;   History reviewed. No pertinent family history. History  Substance Use Topics  . Smoking status: Never Smoker   . Smokeless tobacco: Never Used  . Alcohol Use: No   OB History   Grav Para Term Preterm Abortions TAB SAB Ect Mult Living                 Review of Systems  HENT: Positive for rhinorrhea.   Respiratory: Positive for cough, shortness of breath and wheezing.   Cardiovascular: Negative for chest pain and leg swelling.  All other systems reviewed and are negative.    Allergies  Tylenol with codeine #3 and Penicillins  Home Medications   Current Outpatient Rx   Name  Route  Sig  Dispense  Refill  . aspirin EC 81 MG tablet   Oral   Take 81 mg by mouth daily.         . bimatoprost (LUMIGAN) 0.01 % SOLN   Both Eyes   Place 1 drop into both eyes at bedtime.          BP 162/82  Pulse 76  Temp(Src) 97.8 F (36.6 C) (Oral)  Resp 22  SpO2 97% Physical Exam  Constitutional: She is oriented to person, place, and time. She appears well-developed and well-nourished. No distress.  HENT:  Head: Normocephalic and atraumatic.  Right Ear: Hearing normal.  Left Ear: Hearing normal.  Nose: Nose normal.  Mouth/Throat: Oropharynx is clear and moist and mucous membranes are normal.  Eyes: Conjunctivae and EOM are normal. Pupils are equal, round, and reactive to light.  Neck: Normal range of motion. Neck supple.  Cardiovascular: Regular rhythm, S1 normal and S2 normal.  Exam reveals no gallop and no friction rub.   No murmur heard. Pulmonary/Chest: Effort normal and breath sounds normal. No respiratory distress. She exhibits no tenderness.  Abdominal: Soft. Normal appearance and bowel sounds are normal. There is no hepatosplenomegaly. There is no tenderness. There is no rebound, no guarding, no tenderness at McBurney's point and negative Murphy's sign. No hernia.  Musculoskeletal: Normal range of motion. She exhibits edema (bilateral pedakl edema).  Neurological: She is alert and oriented to  person, place, and time. She has normal strength. No cranial nerve deficit or sensory deficit. Coordination normal. GCS eye subscore is 4. GCS verbal subscore is 5. GCS motor subscore is 6.  Skin: Skin is warm, dry and intact. No rash noted. No cyanosis.  Psychiatric: She has a normal mood and affect. Her speech is normal and behavior is normal. Thought content normal.    ED Course  Procedures (including critical care time) Labs Review Labs Reviewed  CBC WITH DIFFERENTIAL - Abnormal; Notable for the following:    RBC 5.14 (*)    Hemoglobin 16.3 (*)    HCT 48.7  (*)    Neutrophils Relative % 81 (*)    Neutro Abs 7.8 (*)    Lymphocytes Relative 7 (*)    All other components within normal limits  COMPREHENSIVE METABOLIC PANEL - Abnormal; Notable for the following:    Glucose, Bld 100 (*)    GFR calc non Af Amer 58 (*)    GFR calc Af Amer 67 (*)    All other components within normal limits  PRO B NATRIURETIC PEPTIDE - Abnormal; Notable for the following:    Pro B Natriuretic peptide (BNP) 732.9 (*)    All other components within normal limits  URINALYSIS, ROUTINE W REFLEX MICROSCOPIC - Abnormal; Notable for the following:    Ketones, ur TRACE (*)    Leukocytes, UA SMALL (*)    All other components within normal limits  URINE MICROSCOPIC-ADD ON - Abnormal; Notable for the following:    Squamous Epithelial / LPF FEW (*)    Bacteria, UA FEW (*)    All other components within normal limits  URINE CULTURE  TROPONIN I   Imaging Review Dg Chest 2 View  03/25/2013   CLINICAL DATA:  Shortness of breath.  EXAM: CHEST  2 VIEW  COMPARISON:  08/23/2012  FINDINGS: The cardiac silhouette is upper limits of normal in size, unchanged. The lungs remain hyperinflated. Mild, diffuse prominence of the interstitium throughout both lungs is unchanged. There is no evidence of focal airspace consolidation, edema, pleural effusion, or pneumothorax. No acute osseous abnormality is identified.  IMPRESSION: Stable appearance of the chest without evidence of acute airspace disease.   Electronically Signed   By: Sebastian Ache   On: 03/25/2013 17:19    EKG Interpretation    Date/Time:  Tuesday March 25 2013 16:38:11 EST Ventricular Rate:  63 PR Interval:  190 QRS Duration: 84 QT Interval:  412 QTC Calculation: 421 R Axis:   85 Text Interpretation:  Normal sinus rhythm Possible Left atrial enlargement Borderline ECG When compared with ECG of 09-Jun-2012 12:36, Vent. rate has decreased BY  31 BPM T wave inversion no longer evident in Inferior leads Confirmed by POLLINA   MD, CHRISTOPHER ([id number]) on 03/25/2013 5:33:45 PM            MDM  Diagnosis: Acute bronchitis; UTI  Patient presents to the ER for evaluation at the request of urgent care. Patient has been sick for 2 days with upper respiratory infection symptoms. She also has had some back and flank discomfort. Urticaria became concerned about the possibility of congestive heart failure, although it is not clear why they felt this. His workup here reveals no evidence of acute pulmonary edema or congestive heart failure. She had bronchospasm present on initial evaluation which improved with albuterol. Patient will therefore be treated for acute bronchitis with continued bronchodilators, prednisone, antibiotic coverage. In addition she has evidence of urinary tract infection  which will be covered with antibiotics.    Gilda Crease, MD 03/25/13 [phone]

## 2013-03-25 NOTE — ED Notes (Signed)
Advised patient we needed urine specimen.  Unable to go to bathroom at this time.

## 2013-03-27 LAB — URINE CULTURE: Colony Count: >=100000

## 2013-03-28 ENCOUNTER — Telehealth (HOSPITAL_COMMUNITY): Payer: Self-pay | Admitting: Emergency Medicine

## 2013-03-28 NOTE — ED Notes (Signed)
Post ED Visit - Positive Culture Follow-up  Culture report reviewed by antimicrobial stewardship pharmacist: []  Wes Dulaney, Pharm.D., BCPS [x]  Celedonio Miyamoto, [address].D., BCPS []  Georgina Pillion, [address].D., BCPS []  [place], [address].D., BCPS, AAHIVP []  Estella Husk, Pharm.D., BCPS, AAHIVP  Positive urine culture Treated with Keflex, organism sensitive to the same and no further patient follow-up is required at this time.  Kylie A Holland 03/28/2013, 11:10 AM

## 2013-04-03 ENCOUNTER — Encounter (HOSPITAL_COMMUNITY): Payer: Self-pay | Admitting: Emergency Medicine

## 2013-04-03 ENCOUNTER — Emergency Department (HOSPITAL_COMMUNITY)
Admission: EM | Admit: 2013-04-03 | Discharge: 2013-04-03 | Disposition: A | Discharge status: Home / Self Care | Payer: Medicare PPO | Attending: Emergency Medicine | Admitting: Emergency Medicine

## 2013-04-03 ENCOUNTER — Emergency Department (HOSPITAL_COMMUNITY): Payer: Medicare PPO

## 2013-04-03 DIAGNOSIS — Z792 Long term (current) use of antibiotics: Secondary | ICD-10-CM | POA: Insufficient documentation

## 2013-04-03 DIAGNOSIS — Z7982 Long term (current) use of aspirin: Secondary | ICD-10-CM | POA: Insufficient documentation

## 2013-04-03 DIAGNOSIS — Z880 Allergy status to penicillin: Secondary | ICD-10-CM | POA: Insufficient documentation

## 2013-04-03 DIAGNOSIS — N189 Chronic kidney disease, unspecified: Secondary | ICD-10-CM | POA: Insufficient documentation

## 2013-04-03 DIAGNOSIS — Z9089 Acquired absence of other organs: Secondary | ICD-10-CM | POA: Insufficient documentation

## 2013-04-03 DIAGNOSIS — R05 Cough: Secondary | ICD-10-CM

## 2013-04-03 DIAGNOSIS — IMO0002 Reserved for concepts with insufficient information to code with codable children: Secondary | ICD-10-CM | POA: Insufficient documentation

## 2013-04-03 DIAGNOSIS — R059 Cough, unspecified: Secondary | ICD-10-CM | POA: Insufficient documentation

## 2013-04-03 DIAGNOSIS — Z79899 Other long term (current) drug therapy: Secondary | ICD-10-CM | POA: Insufficient documentation

## 2013-04-03 MED ORDER — BENZONATATE 100 MG PO CAPS: 100.0000 mg | ORAL_CAPSULE | Freq: Three times a day (TID) | ORAL | Status: DC | PRN

## 2013-04-03 NOTE — ED Provider Notes (Signed)
CSN: [id number]     Arrival date & time 04/03/13  1749 History   First MD Initiated Contact with Patient 04/03/13 1818 This chart was scribed for Nelia Shi, MD by Valera Castle, ED Scribe. This patient was seen in room APA18/APA18 and the patient's care was started at 7:07 PM.      Chief Complaint  Patient presents with  . Flank Pain   The history is provided by the patient and the spouse. No language interpreter was used.   HPI Comments: Tracy [name] is a 79 y.o. female brought in with her husband who presents to the Emergency Department complaining of recurrent, moderate, constant, right flank pain, that radiates around to her lower back and neck. She reports an associated deep cough, productive of clear sputum, that makes her stomach hurt. She reports she still has her inhaler, but denies using it due to fear that it was no good after leaving it outside in the cold. She reports being seen here before for bronchitis and similar flank pain. She states she has had some bowel movements. She reports sleeping well in the evenings, especially last night. She reports taking all her medication regularly. She denies fever, and any other associated symptoms. Pt has h/o hernia, appendectomy, and chronic kidney disease.   PCP - No PCP Per Patient  Past Medical History  Diagnosis Date  . Shortness of breath   . Chronic kidney disease     hx kidney infection   Past Surgical History  Procedure Laterality Date  . Hernia repair      umbilical  . Appendectomy    . Cataract extraction w/phaco Right 08/28/2012    Procedure: CATARACT EXTRACTION PHACO AND INTRAOCULAR LENS PLACEMENT (IOC);  Surgeon: Chalmers Guest, MD;  Location: [facility] OR;  Service: Ophthalmology;  Laterality: Right;   History reviewed. No pertinent family history. History  Substance Use Topics  . Smoking status: Never Smoker   . Smokeless tobacco: Never Used  . Alcohol Use: No   OB History   Grav Para Term Preterm Abortions TAB  SAB Ect Mult Living                 Review of Systems A complete 10 system review of systems was obtained and all systems are negative except as noted in the HPI and PMH.   Allergies  Tylenol with codeine #3 and Penicillins  Home Medications   Current Outpatient Rx  Name  Route  Sig  Dispense  Refill  . aspirin EC 81 MG tablet   Oral   Take 81 mg by mouth daily.         . benzonatate (TESSALON PERLES) 100 MG capsule   Oral   Take 1 capsule (100 mg total) by mouth 3 (three) times daily as needed for cough.   20 capsule   0   . bimatoprost (LUMIGAN) 0.01 % SOLN   Both Eyes   Place 1 drop into both eyes at bedtime.         . Bromfenac Sodium (PROLENSA) 0.07 % SOLN   Ophthalmic   Apply 1 drop to eye daily.         . cephALEXin (KEFLEX) 500 MG capsule   Oral   Take 1 capsule (500 mg total) by mouth 4 (four) times daily.   28 capsule   0   . predniSONE (DELTASONE) 20 MG tablet      3 tabs po day one, then 2 tabs daily x 4 days  11 tablet   0    BP 148/55  Pulse 66  Temp(Src) 98 F (36.7 C) (Oral)  Resp 18  Ht 5' (1.524 m)  Wt 147 lb (66.679 kg)  BMI 28.71 kg/m2  SpO2 98%  Physical Exam  Nursing note and vitals reviewed. Constitutional: She is oriented to person, place, and time. She appears well-developed and well-nourished. No distress.  HENT:  Head: Normocephalic and atraumatic.  Eyes: Pupils are equal, round, and reactive to light.  Neck: Normal range of motion.  Cardiovascular: Normal rate and intact distal pulses.   Pulmonary/Chest: No respiratory distress.  Abdominal: Normal appearance. She exhibits no distension.    Musculoskeletal: Normal range of motion.  Neurological: She is alert and oriented to person, place, and time. No cranial nerve deficit.  Skin: Skin is warm and dry. No rash noted.  Psychiatric: She has a normal mood and affect. Her behavior is normal.    ED Course  Procedures (including critical care time)  DIAGNOSTIC  STUDIES: Oxygen Saturation is 98% on room air, normal by my interpretation.    COORDINATION OF CARE: 7:13 PM-Discussed treatment plan with pt at bedside and pt agreed to plan.   Labs Review Labs Reviewed - No data to display Imaging Review Dg Chest 2 View  04/03/2013   CLINICAL DATA:  Cough and shortness of breath. Chronic kidney disease.  EXAM: CHEST  2 VIEW  COMPARISON:  03/25/2013  FINDINGS: Mild to moderate cardiomegaly is stable. Changes of COPD are also unchanged. No evidence of acute or superimposed infiltrate. No evidence of pulmonary edema. No evidence of pleural effusion. No mass or lymphadenopathy identified.  IMPRESSION: Stable cardiomegaly and COPD.  No acute findings.   Electronically Signed   By: Myles Rosenthal M.D.   On: 04/03/2013 19:45    EKG Interpretation   None      Meds ordered this encounter  Medications  . benzonatate (TESSALON PERLES) 100 MG capsule    Sig: Take 1 capsule (100 mg total) by mouth 3 (three) times daily as needed for cough.    Dispense:  20 capsule    Refill:  0    MDM   1. Cough       I personally performed the services described in this documentation, which was scribed in my presence. The recorded information has been reviewed and considered.    Nelia Shi, MD 04/03/13 [phone]

## 2013-04-03 NOTE — ED Notes (Signed)
Pain rt flank, seen  Here recently for similar sx   Pain goes around her back and and neck.

## 2013-04-26 ENCOUNTER — Emergency Department (HOSPITAL_COMMUNITY)
Admission: EM | Admit: 2013-04-26 | Discharge: 2013-04-26 | Disposition: A | Discharge status: Home / Self Care | Payer: Medicare PPO | Attending: Emergency Medicine | Admitting: Emergency Medicine

## 2013-04-26 ENCOUNTER — Encounter (HOSPITAL_COMMUNITY): Payer: Self-pay | Admitting: Emergency Medicine

## 2013-04-26 ENCOUNTER — Emergency Department (HOSPITAL_COMMUNITY): Payer: Medicare PPO

## 2013-04-26 DIAGNOSIS — Y929 Unspecified place or not applicable: Secondary | ICD-10-CM | POA: Insufficient documentation

## 2013-04-26 DIAGNOSIS — Z9089 Acquired absence of other organs: Secondary | ICD-10-CM | POA: Insufficient documentation

## 2013-04-26 DIAGNOSIS — W19XXXA Unspecified fall, initial encounter: Secondary | ICD-10-CM

## 2013-04-26 DIAGNOSIS — N189 Chronic kidney disease, unspecified: Secondary | ICD-10-CM | POA: Insufficient documentation

## 2013-04-26 DIAGNOSIS — S3981XA Other specified injuries of abdomen, initial encounter: Secondary | ICD-10-CM | POA: Insufficient documentation

## 2013-04-26 DIAGNOSIS — R109 Unspecified abdominal pain: Secondary | ICD-10-CM

## 2013-04-26 DIAGNOSIS — R296 Repeated falls: Secondary | ICD-10-CM | POA: Insufficient documentation

## 2013-04-26 DIAGNOSIS — K439 Ventral hernia without obstruction or gangrene: Secondary | ICD-10-CM | POA: Insufficient documentation

## 2013-04-26 DIAGNOSIS — Z9851 Tubal ligation status: Secondary | ICD-10-CM | POA: Insufficient documentation

## 2013-04-26 DIAGNOSIS — Z880 Allergy status to penicillin: Secondary | ICD-10-CM | POA: Insufficient documentation

## 2013-04-26 DIAGNOSIS — J90 Pleural effusion, not elsewhere classified: Secondary | ICD-10-CM | POA: Insufficient documentation

## 2013-04-26 DIAGNOSIS — Y9389 Activity, other specified: Secondary | ICD-10-CM | POA: Insufficient documentation

## 2013-04-26 NOTE — ED Provider Notes (Signed)
CSN: [id number]     Arrival date & time 04/26/13  1444 History   First MD Initiated Contact with Patient 04/26/13 1727     This chart was scribed for Donnetta HutchingBrian Haile Bosler, MD by Arlan OrganAshley Leger, ED Scribe. This patient was seen in room APA01/APA01 and the patient's care was started 6:16 PM.   Chief Complaint  Patient presents with  . Fall   The history is provided by the patient and a relative. No language interpreter was used.    HPI Comments: Tracy [name] is a 80 y.o. female who presents to the Emergency Department complaining of a fall that occurred a few hours prior to arrival. Pt states she went to sit down, missed the couch, and landed on the ground. She now c/o gradual onset, constant, moderate pain to her RLQ. She states the pain is worsened when she stands up and with forceful breathing. Pt states she was able to ambulate without difficulty after the time of the fall. She states she has been eating normally. She denies dysuria or SOB.    Pt states she does not currently have a PCP Past Medical History  Diagnosis Date  . Shortness of breath   . Chronic kidney disease     hx kidney infection   Past Surgical History  Procedure Laterality Date  . Hernia repair      umbilical  . Appendectomy    . Cataract extraction w/phaco Right 08/28/2012    Procedure: CATARACT EXTRACTION PHACO AND INTRAOCULAR LENS PLACEMENT (IOC);  Surgeon: Chalmers Guestoy Whitaker, MD;  [facility];  Service: Ophthalmology;  Laterality: Right;  . Tubal ligation     History reviewed. No pertinent family history. History  Substance Use Topics  . Smoking status: Never Smoker   . Smokeless tobacco: Never Used  . Alcohol Use: No   OB History   Grav Para Term Preterm Abortions TAB SAB Ect Mult Living                 Review of Systems  A complete 10 system review of systems was obtained and all systems are negative except as noted in the HPI and PMH.    Allergies  Penicillins and Tylenol with codeine #3  Home  Medications   Current Outpatient Rx  Name  Route  Sig  Dispense  Refill  . bimatoprost (LUMIGAN) 0.01 % SOLN   Both Eyes   Place 1 drop into both eyes at bedtime.          Triage Vitals: BP 143/76  Pulse 94  Temp(Src) 98.1 F (36.7 C) (Oral)  Resp 20  Ht 5' (1.524 m)  Wt 147 lb (66.679 kg)  BMI 28.71 kg/m2  SpO2 97%  Physical Exam  Nursing note and vitals reviewed. Constitutional: She is oriented to person, place, and time. She appears well-developed and well-nourished.  HENT:  Head: Normocephalic and atraumatic.  Eyes: Conjunctivae and EOM are normal. Pupils are equal, round, and reactive to light.  Neck: Normal range of motion. Neck supple.  Cardiovascular: Normal rate, regular rhythm and normal heart sounds.   Pulmonary/Chest: Effort normal and breath sounds normal.  Abdominal: Soft. Bowel sounds are normal. There is tenderness.  15 cm ventral hernia in lower abdomen that is not new. Minimally tender around RLQ  Musculoskeletal: Normal range of motion.  Neurological: She is alert and oriented to person, place, and time.  Skin: Skin is warm and dry.  Psychiatric: She has a normal mood and affect. Her behavior is  normal.    ED Course  Procedures (including critical care time)  DIAGNOSTIC STUDIES: Oxygen Saturation is 97% on RA, normal by my interpretation.    COORDINATION OF CARE: 6:18 PM- Will order X-Ray. Discussed treatment plan with pt at bedside and pt agreed to plan.     Labs Review Labs Reviewed - No data to display Imaging Review Dg Abd Acute W/chest  04/26/2013   CLINICAL DATA:  Back and abdominal pain  EXAM: ACUTE ABDOMEN SERIES (ABDOMEN 2 VIEW & CHEST 1 VIEW)  COMPARISON:  04/03/2013  FINDINGS: Cardiac shadow is stable. Small pleural effusions are noted bilaterally which were not present on the prior exam. The abdomen shows a nonobstructive bowel gas pattern. No free air is seen. No acute bony abnormality is noted.  IMPRESSION: Small bilateral pleural  effusions.  No acute abdominal abnormality is noted.   Electronically Signed   By: Alcide Clever M.D.   On: 04/26/2013 19:04    EKG Interpretation   None       MDM  No diagnosis found. No acute abdomen. Small bilateral pleural effusions noted. Do not think they are related to the fall. Blood pressure is stable  I personally performed the services described in this documentation, which was scribed in my presence. The recorded information has been reviewed and is accurate.   Donnetta Hutching, MD 04/26/13 [phone]

## 2013-04-26 NOTE — ED Notes (Signed)
Pt c/o falling from a magazine rack to the floor last night. C/o pain to right lower quad abd pain, has very extensive abd hernia and is worried that she may have "hurt" the hernia.

## 2013-04-26 NOTE — Discharge Instructions (Signed)
X-ray showed no acute problems. Return if worse in any way.

## 2013-04-26 NOTE — ED Notes (Signed)
Pt sat on magazine rack and fell backwards, complaining of right side pain worsened with deep breathing. Able to ambulate with steady gait. Denies SOB

## 2014-02-04 ENCOUNTER — Encounter (HOSPITAL_COMMUNITY): Payer: Self-pay | Admitting: Emergency Medicine

## 2014-02-04 ENCOUNTER — Emergency Department (HOSPITAL_COMMUNITY): Payer: Medicare PPO

## 2014-02-04 ENCOUNTER — Emergency Department (HOSPITAL_COMMUNITY)
Admission: EM | Admit: 2014-02-04 | Discharge: 2014-02-04 | Disposition: A | Discharge status: Home / Self Care | Payer: Medicare PPO | Attending: Emergency Medicine | Admitting: Emergency Medicine

## 2014-02-04 DIAGNOSIS — Y92238 Other place in hospital as the place of occurrence of the external cause: Secondary | ICD-10-CM | POA: Diagnosis not present

## 2014-02-04 DIAGNOSIS — S60221A Contusion of right hand, initial encounter: Secondary | ICD-10-CM | POA: Insufficient documentation

## 2014-02-04 DIAGNOSIS — K469 Unspecified abdominal hernia without obstruction or gangrene: Secondary | ICD-10-CM | POA: Insufficient documentation

## 2014-02-04 DIAGNOSIS — W010XXA Fall on same level from slipping, tripping and stumbling without subsequent striking against object, initial encounter: Secondary | ICD-10-CM

## 2014-02-04 DIAGNOSIS — W101XXA Fall (on)(from) sidewalk curb, initial encounter: Secondary | ICD-10-CM | POA: Insufficient documentation

## 2014-02-04 DIAGNOSIS — N189 Chronic kidney disease, unspecified: Secondary | ICD-10-CM | POA: Insufficient documentation

## 2014-02-04 DIAGNOSIS — S59902A Unspecified injury of left elbow, initial encounter: Secondary | ICD-10-CM | POA: Insufficient documentation

## 2014-02-04 DIAGNOSIS — S8002XA Contusion of left knee, initial encounter: Secondary | ICD-10-CM | POA: Diagnosis not present

## 2014-02-04 DIAGNOSIS — Z87440 Personal history of urinary (tract) infections: Secondary | ICD-10-CM | POA: Diagnosis not present

## 2014-02-04 DIAGNOSIS — S8992XA Unspecified injury of left lower leg, initial encounter: Secondary | ICD-10-CM | POA: Diagnosis present

## 2014-02-04 DIAGNOSIS — Y9389 Activity, other specified: Secondary | ICD-10-CM | POA: Diagnosis not present

## 2014-02-04 DIAGNOSIS — Z880 Allergy status to penicillin: Secondary | ICD-10-CM | POA: Insufficient documentation

## 2014-02-04 NOTE — ED Notes (Signed)
Pt was in the front of the hospital and fell on the crosswalk. She hit her left knee, left elbow, and right hand. Pt comes in to make sure that everything is ok. Denies hitting her head.

## 2014-02-04 NOTE — Discharge Instructions (Signed)
Contusion °A contusion is a deep bruise. Contusions are the result of an injury that caused bleeding under the skin. The contusion may turn blue, purple, or yellow. Minor injuries will give you a painless contusion, but more severe contusions may stay painful and swollen for a few weeks.  °CAUSES  °A contusion is usually caused by a blow, trauma, or direct force to an area of the body. °SYMPTOMS  °· Swelling and redness of the injured area. °· Bruising of the injured area. °· Tenderness and soreness of the injured area. °· Pain. °DIAGNOSIS  °The diagnosis can be made by taking a history and physical exam. An X-ray, CT scan, or MRI may be needed to determine if there were any associated injuries, such as fractures. °TREATMENT  °Specific treatment will depend on what area of the body was injured. In general, the best treatment for a contusion is resting, icing, elevating, and applying cold compresses to the injured area. Over-the-counter medicines may also be recommended for pain control. Ask your caregiver what the best treatment is for your contusion. °HOME CARE INSTRUCTIONS  °· Put ice on the injured area. °¨ Put ice in a plastic bag. °¨ Place a towel between your skin and the bag. °¨ Leave the ice on for 15-20 minutes, 3-4 times a day, or as directed by your health care provider. °· Only take over-the-counter or prescription medicines for pain, discomfort, or fever as directed by your caregiver. Your caregiver may recommend avoiding anti-inflammatory medicines (aspirin, ibuprofen, and naproxen) for 48 hours because these medicines may increase bruising. °· Rest the injured area. °· If possible, elevate the injured area to reduce swelling. °SEEK IMMEDIATE MEDICAL CARE IF:  °· You have increased bruising or swelling. °· You have pain that is getting worse. °· Your swelling or pain is not relieved with medicines. °MAKE SURE YOU:  °· Understand these instructions. °· Will watch your condition. °· Will get help right  away if you are not doing well or get worse. °Document Released: 01/11/2005 Document Revised: 04/08/2013 Document Reviewed: 02/06/2011 °ExitCare® Patient Information ©2015 [employer]. This information is not intended to replace advice given to you by your health care provider. Make sure you discuss any questions you have with your health care provider. ° °Emergency Department Resource Guide °1) Find a Doctor and Pay Out of Pocket °Although you won't have to find out who is covered by your insurance plan, it is a good idea to ask around and get recommendations. You will then need to call the office and see if the doctor you have chosen will accept you as a new patient and what types of options they offer for patients who are self-pay. Some doctors offer discounts or will set up payment plans for their patients who do not have insurance, but you will need to ask so you aren't surprised when you get to your appointment. ° °2) Contact Your Local Health Department °Not all health departments have doctors that can see patients for sick visits, but many do, so it is worth a call to see if yours does. If you don't know where your local health department is, you can check in your phone book. The CDC also has a tool to help you locate your state's health department, and many state websites also have listings of all of their local health departments. ° °3) Find a Walk-in Clinic °If your illness is not likely to be very severe or complicated, you may want to try a walk   in clinic. These are popping up all over the country in pharmacies, drugstores, and shopping centers. They're usually staffed by nurse practitioners or physician assistants that have been trained to treat common illnesses and complaints. They're usually fairly quick and inexpensive. However, if you have serious medical issues or chronic medical problems, these are probably not your best option. ° °No Primary Care Doctor: °- Call [facility] Connect at  832-8000 -  they can help you locate a primary care doctor that  accepts your insurance, provides certain services, etc. °- Physician Referral Service- 1-800-533-3463 ° °Chronic Pain Problems: °Organization         Address  Phone   Notes  °[facility]  (336) 297-2271 Patients need to be referred by their primary care doctor.  ° °Medication Assistance: °Organization         Address  Phone   Notes  °[place] [facility] [address] °[place], [place] [address] (336) 641-8030 --Must be a resident of [place] °-- Must have NO insurance coverage whatsoever (no Medicaid/ Medicare, etc.) °-- The pt. MUST have a primary care doctor that directs their care regularly and follows them in the community °  °[facility]  (866) 331-1348   °[employer]  (888) 892-1162   ° °Agencies that provide inexpensive medical care: °Organization         Address  Phone   Notes  °[facility]  (336) 832-8035   °[facility]    (336) 832-7272   °[facility] [address] °[place], [place] [address] (336) 832-4777   °[facility] [address], °[place] (336) 271-4999   °[facility]    (336) 373-0678   °[facility]    (336) 272-1050   °[facility] ° [address], [place] Phone:  (336) 832-4444, Fax:  (336) 832-4440 Hours of Operation:  9 am - 6 pm, M-F.  Also accepts Medicaid/Medicare and self-pay.  ° [facility] ° [address], [place] Phone: (336) 832-3150, Fax: (336) 832-3151. Hours of Operation:  8:30 am - 5:30 pm, M-F.  Also accepts Medicaid and self-pay.  °[facility] [place] [address], [place] Phone: (336) 878-6027   °[facility] [address], [place], [place] (336)723-1848, Ext. 123 Mondays & Thursdays: 7-9 AM.  First 15 patients are seen on a first come, first serve basis. °  ° °Medicaid-accepting  [place] Providers: ° °Organization         Address  Phone   Notes  °[facility] [address], [place] (336) 641-2100 Also accepts self-pay patients.  °[facility] [address], [place] ° (336) 856-9996   °[facility] [address], [place] (336) 288-8857   °[facility] [address] [place] [address], [place] (336) 299-7000   °Veita Bland [address], [place]  ° (336) 373-1557 Only accepts [place] Access Medicaid patients after they have their name applied to their card.  ° °Self-Pay (no insurance) in [place]: ° °Organization         Address  Phone   Notes  °Sickle Cell Patients, [facility] [address], [place] (336) 832-1970   °[facility] [address], [place] (336) 832-4400   °[facility] ° [address], [place] (336) 992-4800   °[facility]/Dr. Osei-Bonsu ° [address] [place] [address], [place] or [address], [place] (336) 841-8500 Phone number for both [place] and [place] locations is the same.  °[facility] [address], [place] (336) 299-0000   °[facility] [address] [place] [address], [place] or [address] (336) 852-7530 °(336) 878-2260   °[facility] [address], [place] (336) 350-1642, phone; (336) 294-5005, fax Sees patients 1st and 3rd Saturday of every month.  Must not qualify for public or private insurance (i.e. Medicaid, Medicare, [place] Health Choice, Veterans' Benefits) • Household income should be no more than 200% of the poverty level •The clinic cannot treat you if you are pregnant or think you are pregnant • Sexually transmitted diseases are not treated at the clinic.  ° ° °Dental Care: °Organization         Address  Phone  Notes  °[place] [facility] [address], [place] (336) 641-6152 Accepts children up to age 21 who are enrolled in Medicaid or [place] Health Choice; pregnant women with a Medicaid card; and children who have applied for Medicaid or [place] Health Choice, but were declined, whose parents can pay a reduced fee at time of service.  °[facility] [place]  [address], [place] (336) 641-7733 Accepts children up to age 21 who are enrolled in Medicaid or [place] Health Choice; pregnant women with a Medicaid card; and children who have applied for Medicaid or [place] Health Choice, but were declined, whose parents can pay a reduced fee at time of service.  °[facility] ° [address], [place] (336) 641-4533 Patients are seen by appointment only. Walk-ins are not accepted. [facility] will see patients 18 years of age and older. °Monday - Tuesday (8am-5pm) °Most Wednesdays (8:30-5pm) °$30 per visit, cash only  °[facility] ° [address], [place] (336) 641-4533 Patients are seen by appointment only. Walk-ins are not accepted. [facility] will see patients 18 years of age and older. °One Wednesday Evening (Monthly: Volunteer Based).  $30 per visit, cash only  °[facility]  (919) 537-3737 for adults; Children under age 4, call Graduate Pediatric Dentistry at (919) [phone]. Children aged 4-14, please call (919) [phone] to request a pediatric application. ° Dental services are provided in all areas of dental care including fillings, crowns and bridges, complete and partial dentures, implants, gum treatment, root canals, and extractions. Preventive care is also provided. Treatment is provided to both adults and children. °Patients are selected via a lottery and there is often a waiting list. °  °[facility] [address], ° ° (336) 763-8833 [email or web address] °  °[facility] [address], [place], [place] (336)723-1848, Ext. 123 Second and Fourth Thursday of each month, opens at 6:30 AM; Clinic ends at 9 AM.  Patients are seen on a first-come first-served basis, and a limited number are seen during each clinic.  ° °[facility] ° [address], [place],  (336) 723-7904   Eligibility Requirements °You must have lived in [place], [place], or [place] counties for at least the last three months. °  You cannot be eligible for state or federal sponsored healthcare insurance,   including Veterans Administration, Medicaid, or Medicare. °  You generally cannot be eligible for healthcare insurance through your employer.  °  How to apply: °Eligibility screenings are held every Tuesday and Wednesday afternoon from 1:00 pm until 4:00 pm. You do not need an appointment for the interview!  °[facility] [address], [place], [place] 336-631-2330   °[facility] [facility]  336-342-8273   °[facility]  336-703-3100   °[facility]  336-570-6415   ° °Behavioral Health Resources in the Community: °Intensive Outpatient Programs °Organization         Address  Phone  Notes  °[facility] [address], [place], [place] 336-878-6098   °[facility] [address], [place], [place] 336-832-9800   °[facility] [address], [place], [place] ° 336-882-2125   °[facility] [address],  °[place], [place] 1-800-853-5163 or 336-641-4981   °Substance Abuse Resources °Organization         Address  Phone  Notes  °[facility]  336-882-2125   °[facility]  336-784-9470   °[facility]  336-285-9073   °[facility]  336-845-3988   °Residential & Outpatient Substance Abuse Program  1-800-659-3381   °Psychological Services °Organization         Address  Phone  Notes  °[facility]   336- 832-9600   °[facility]  336- 378-7881   °[facility] [address], [place] 1-800-853-5163 or 336-641-4981   ° °Mobile Crisis Teams °Organization         Address  Phone  Notes  °[facility], Mobile Crisis Care Unit  1-877-626-1772   °Assertive °[facility] ° [address] [place], [place] 336-834-9664   °Sharon DeEsch [address] °[place] 336-554-5454   ° °Self-Help/Support Groups °Organization         Address  Phone             Notes  °[facility] [place] - variety of support groups  336- 373-1402 Call for more information  °Narcotics Anonymous (NA), [facility] [address], °[place]  2 meetings at this location  ° °Residential Treatment Programs °Organization         Address  Phone  Notes  °[facility] [address],    °[place]  1-866-801-8205   °[facility] ° [address], [place], [place] 704-293-8524   °[facility] [address], [place] 336-845-3988 Admissions: 8am-3pm M-F  °[facility] [address]    °[place], [place] 336-841-1104   °[facility] [address], [place], [place] 336-379-7146   °[facility] [address]  °[place], [place] 336-285-9073   °[facility] - Intensive Outpatient [address], , [place] 336-852-3033   °[facility] [address]  °[place], [place] 1-877-615-2722 or 336-784-9470   °[facility] [address] [place], [place] 336-227-7417 Accepts Medicaid  °[facility] [address]  ° [place] 1-800-659-3381 Substance Abuse/Addiction Treatment  ° °[place] Behavioral Health Resources °Organization         Address  Phone  Notes  °[facility]  (888) 581-9988   °Julie Brannon, PhD [address] [place],    (336) 349-5553 or  (336)   951-0000   °[facility]   [address] °[place], [place] (336) 349-4454   °[facility] [address], [place], [place] (336) 342-8316 Insurance/Medicaid/sponsorship through [facility]  °[facility] [address]                                    [place], [place] (336) 342-8316 Therapy/tele-psych/case  °[facility] [address]  ° [place], [place] (336) 349-2233    °Dr. Arfeen  (336) 349-4544   °[facility] [place]  [employer] [place] [facility] 1) [address], [place] °2) [address], [place] °3)  [address], [place] (336) 349-3220 °(336) 342-7768 ° °(336) 342-8140   °[place] [facility] (336) 342-1394 or (336) 342-3537 (After Hours)    ° ° ° °

## 2014-02-04 NOTE — ED Provider Notes (Signed)
[age] year old female had an accidental mechanical fall just prior to arrival, fell on her left side striking her left elbow and left knee. She was able to get up and ambulate with minimal difficulty and denies head injury numbness weakness has had no bleeding. On exam she has full range of motion of both her left elbow and left knee, is able to straight leg raise on the left without any difficulty and has normal sensation distal to her injuries. There is no significant abrasions, no lacerations, no hematomas, no signs of head injury, normal heart and lungs house without murmurs or respiratory distress. We'll obtain imaging of the left elbow and left knee, patient declines pain medication at this time, anticipate discharges home.  Medical screening examination/treatment/procedure(s) were conducted as a shared visit with non-physician practitioner(s) and myself.  I personally evaluated the patient during the encounter.  Clinical Impression:   Final diagnoses:  Contusion, knee, left, initial encounter  Fall from slip, trip, or stumble, initial encounter         Vida RollerBrian D Lillyn Wieczorek, MD 02/06/14 [phone]

## 2014-02-04 NOTE — ED Provider Notes (Signed)
CSN: [id number]     Arrival date & time 02/04/14  1455 History   First MD Initiated Contact with Patient 02/04/14 1504     Chief Complaint  Patient presents with  . Fall     (Consider location/radiation/quality/duration/timing/severity/associated sxs/prior Treatment) Patient is a 80 y.o. female presenting with fall. The history is provided by the patient and a relative.  Fall This is a new problem. The current episode started today. The problem has been unchanged. Associated symptoms include myalgias. Pertinent negatives include no abdominal pain, chest pain, chills, fever, headaches, nausea, neck pain, numbness, vertigo, visual change, vomiting or weakness. She has tried nothing for the symptoms.   Tracy [name] is a 80 year-old female who presents to the ED with her son after a fall from standing around 14:35 today. The patient and her son report that she was coming to the hospital to pay a bill when she tripped on the curb to a side walk landing in grass and cement of the side walk. Reports she fell on her left elbow and knee and is complaining of 2/10 left elbow and knee pain. She also reports a small bruise on her right palm but denies any pain. Patient was ambulatory after the fall and walked to the ED. She denies decreased ROM in any extremity.  She denies LOC, weakness, fatigue, dizziness, HA, neck pain, back pain, changes to her vision. She refuses pain medication at this time.   Past Medical History  Diagnosis Date  . Shortness of breath   . Chronic kidney disease     hx kidney infection   Past Surgical History  Procedure Laterality Date  . Hernia repair      umbilical  . Appendectomy    . Cataract extraction w/phaco Right 08/28/2012    Procedure: CATARACT EXTRACTION PHACO AND INTRAOCULAR LENS PLACEMENT (IOC);  Surgeon: Chalmers Guestoy Whitaker, MD;  [facility];  Service: Ophthalmology;  Laterality: Right;  . Tubal ligation     History reviewed. No pertinent family  history. History  Substance Use Topics  . Smoking status: Never Smoker   . Smokeless tobacco: Never Used  . Alcohol Use: No   OB History   Grav Para Term Preterm Abortions TAB SAB Ect Mult Living                 Review of Systems  Constitutional: Negative for fever and chills.  Cardiovascular: Negative for chest pain.  Gastrointestinal: Negative for nausea, vomiting and abdominal pain.  Musculoskeletal: Positive for myalgias. Negative for neck pain.  Neurological: Negative for vertigo, weakness, numbness and headaches.  All other systems reviewed and are negative.     Allergies  Penicillins and Tylenol with codeine #3  Home Medications   Prior to Admission medications   Medication Sig Start Date End Date Taking? Authorizing Provider  bimatoprost (LUMIGAN) 0.01 % SOLN Place 1 drop into both eyes at bedtime.   Yes Historical Provider, MD   BP 127/61  Pulse 90  Temp(Src) 97.8 F (36.6 C) (Oral)  Resp 17  Ht 5' (1.524 m)  Wt 150 lb (68.04 kg)  BMI 29.30 kg/m2  SpO2 99% Physical Exam  Nursing note and vitals reviewed. Constitutional: She is oriented to person, place, and time. She appears well-developed. She does not appear ill. No distress.  HENT:  Head: Normocephalic and atraumatic. Head is without abrasion, without contusion and without laceration.  Mouth/Throat: Oropharynx is clear and moist.  Eyes: Conjunctivae and EOM are normal. Pupils are equal,  round, and reactive to light.  Neck: Normal range of motion and full passive range of motion without pain. Neck supple. No spinous process tenderness and no muscular tenderness present. No edema and normal range of motion present. No thyromegaly present.  Cardiovascular: Normal rate, regular rhythm, normal heart sounds and intact distal pulses.  Exam reveals no gallop and no friction rub.   No murmur heard. Pulmonary/Chest: Effort normal and breath sounds normal. No respiratory distress. She has no wheezes. She has no  rales. She exhibits no tenderness.  Abdominal: Soft. There is no tenderness. A hernia is present.  Large abdominal hernia  Musculoskeletal:       Left elbow: She exhibits normal range of motion, no swelling, no effusion, no deformity and no laceration. No tenderness found.       Left knee: She exhibits normal range of motion, no swelling, no effusion, no ecchymosis, no deformity, no laceration, no erythema and no bony tenderness. No tenderness found.  Neurological: She is alert and oriented to person, place, and time. She has normal strength. No cranial nerve deficit.  Skin: She is not diaphoretic.  1 cm area of ecchymosis to palmar aspect of right hand.  Small area of redness to left elbow and left knee without edema or ecchymosis.     ED Course  Procedures (including critical care time) Labs Review Labs Reviewed - No data to display  Imaging Review Dg Elbow Complete Left  02/04/2014   CLINICAL DATA:  Status post fall today loop striking the elbow with persistent pain  EXAM: LEFT ELBOW - COMPLETE 3+ VIEW  COMPARISON:  None.  FINDINGS: The bones of the elbow are mildly osteopenic. The radial head and olecranon are intact. There is a tiny olecranon spur. The condylar and supracondylar regions of the humerus are normal. There is no joint effusion.  IMPRESSION: There is no acute bony abnormality of the right elbow.   Electronically Signed   By: David  [place]azil[place]Jordan   On: 02/04/2014 16:32   Dg Knee Complete 4 Views Left  02/04/2014   CLINICAL DATA:  Fall, acute left knee pain  EXAM: LEFT KNEE - COMPLETE 4+ VIEW  COMPARISON:  None.  FINDINGS: Normal alignment without acute fracture or effusion. No significant arthropathy. Mild soft tissue swelling anteriorly.  IMPRESSION: No acute osseous finding.  Anterior soft tissue swelling.   Electronically Signed   By: Ruel Favorsrevor  Shick M.D.   On: 02/04/2014 16:28     EKG Interpretation None      MDM   Final diagnoses:  Contusion, knee, left, initial  encounter  Fall from slip, trip, or stumble, initial encounter    Left elbow and left knee x-rays are negative for acute bony abnormality. Will discharge home. Advised patient to follow-up as needed with her PCP for continuing pain. Advised to return to the ED with new or worsening symptoms or new concerns. Patient verbalized understanding and agreement with plan.   Patient seen in conjunction with Dr. Hyacinth MeekerMiller.      Lawana ChambersWilliam Duncan Keonia Pasko, [place] 02/05/14 1501

## 2014-02-06 NOTE — ED Provider Notes (Signed)
Medical screening examination/treatment/procedure(s) were conducted as a shared visit with non-physician practitioner(s) and myself.  I personally evaluated the patient during the encounter  Please see my separate respective documentation pertaining to this patient encounter   Vida RollerBrian D Lawson Isabell, MD 02/06/14 [phone]

## 2015-09-10 ENCOUNTER — Telehealth: Payer: Self-pay | Admitting: *Deleted

## 2015-09-10 ENCOUNTER — Encounter: Payer: Self-pay | Admitting: Cardiology

## 2015-09-10 NOTE — Telephone Encounter (Addendum)
Left message on Dr. Sherril [facility] nurse Tina's voicemail to call office with instructions for patient.

## 2015-09-10 NOTE — Telephone Encounter (Signed)
Tracy RoundsSally informed that [facility] to [facility] result. Per Tracy RoundsSally, patient is to hold coumadin today, then resume previous instructions.

## 2015-09-10 NOTE — Telephone Encounter (Signed)
Received call from GilbertLydia, LPN in [place] office regarding pt's INR.  Pt [facility] to [facility].  We have no history available.  Given she will be completely new to the practice, will need to see cardiologist prior to anticoagulation appointment so that we will have some idea of her history and treatment goals.  Isabelle CourseLydia is agreeable with plan and will reach out to pt's previous MD regarding issues pt complains of today.

## 2015-09-10 NOTE — Progress Notes (Signed)
This encounter was created in error - please disregard.

## 2015-09-10 NOTE — Telephone Encounter (Signed)
Per Inetta Fermoina at [facility], patient is taking coumadin 2.5 daily except none on Wednesday and Saturday. Per Tina's request, information sent to Dr. Sherril [facility] for dosing of coumadin. Note faxed.

## 2015-09-10 NOTE — Telephone Encounter (Addendum)
Patient walked into office c/o bruise that she is concerned about since she is on coumadin. Nurse looked at area that she complained about and saw a nickel size bruise on medial right knee. No other bruising noted. INR checked today and was 3.3. Patient said she currently takes coumadin 5 mg daily except Wednesday and Saturday she doesn't take any coumadin.  [facility] contacted to verify medications and doses. See medication profile for details. [facility] contacted for advise. Per Kennon RoundsSally, since patient is not a patient here yet and since there is no history available, instructions for coumadin is deferred to Dr. Sherril CroonVyas office who has been managing her coumadin. Dr. Sherril CroonVyas' office contacted and informed that patient was here today with INR of 3.3 so that patient could be instructed on dosing. Patient is scheduled to see INR nurse in clinic on Tuesday. Tina at Dr. Sherril CroonVyas office is taking results to Dr. Sherril CroonVyas for instructions. Awaiting call back from Dr. Sherril CroonVyas' office with instructions.

## 2015-09-10 NOTE — Telephone Encounter (Signed)
Son informed and verbalized understanding of plan. 

## 2015-09-10 NOTE — Telephone Encounter (Signed)
Received a call back from [facility] and spoke with Inetta Fermoina stating that Vyas advise patient to hold coumadin today, then resume previous instructions.

## 2015-09-14 ENCOUNTER — Ambulatory Visit (INDEPENDENT_AMBULATORY_CARE_PROVIDER_SITE_OTHER): Payer: Medicare PPO | Admitting: *Deleted

## 2015-09-14 DIAGNOSIS — Z5181 Encounter for therapeutic drug level monitoring: Secondary | ICD-10-CM | POA: Diagnosis not present

## 2015-09-14 DIAGNOSIS — I4891 Unspecified atrial fibrillation: Secondary | ICD-10-CM | POA: Insufficient documentation

## 2015-09-14 LAB — POCT INR: INR: 1.4

## 2015-09-21 ENCOUNTER — Ambulatory Visit (INDEPENDENT_AMBULATORY_CARE_PROVIDER_SITE_OTHER): Payer: Medicare PPO | Admitting: *Deleted

## 2015-09-21 DIAGNOSIS — I4891 Unspecified atrial fibrillation: Secondary | ICD-10-CM | POA: Diagnosis not present

## 2015-09-21 DIAGNOSIS — Z5181 Encounter for therapeutic drug level monitoring: Secondary | ICD-10-CM | POA: Diagnosis not present

## 2015-09-21 LAB — POCT INR: INR: 2.6

## 2015-09-28 ENCOUNTER — Ambulatory Visit (INDEPENDENT_AMBULATORY_CARE_PROVIDER_SITE_OTHER): Payer: Medicare PPO | Admitting: *Deleted

## 2015-09-28 DIAGNOSIS — I4891 Unspecified atrial fibrillation: Secondary | ICD-10-CM

## 2015-09-28 DIAGNOSIS — Z5181 Encounter for therapeutic drug level monitoring: Secondary | ICD-10-CM | POA: Diagnosis not present

## 2015-09-28 LAB — POCT INR: INR: 1.7

## 2015-10-05 ENCOUNTER — Ambulatory Visit (INDEPENDENT_AMBULATORY_CARE_PROVIDER_SITE_OTHER): Payer: Medicare PPO | Admitting: *Deleted

## 2015-10-05 DIAGNOSIS — I4891 Unspecified atrial fibrillation: Secondary | ICD-10-CM | POA: Diagnosis not present

## 2015-10-05 DIAGNOSIS — Z5181 Encounter for therapeutic drug level monitoring: Secondary | ICD-10-CM

## 2015-10-05 LAB — POCT INR: INR: 1.8

## 2015-10-12 ENCOUNTER — Ambulatory Visit (INDEPENDENT_AMBULATORY_CARE_PROVIDER_SITE_OTHER): Payer: Medicare PPO | Admitting: *Deleted

## 2015-10-12 ENCOUNTER — Encounter: Payer: Self-pay | Admitting: Cardiovascular Disease

## 2015-10-12 ENCOUNTER — Ambulatory Visit (INDEPENDENT_AMBULATORY_CARE_PROVIDER_SITE_OTHER): Payer: Medicare PPO | Admitting: Cardiovascular Disease

## 2015-10-12 VITALS — BP 181/80 | HR 74 | Ht 62.0 in | Wt 153.0 lb

## 2015-10-12 DIAGNOSIS — Z5181 Encounter for therapeutic drug level monitoring: Secondary | ICD-10-CM

## 2015-10-12 DIAGNOSIS — I4891 Unspecified atrial fibrillation: Secondary | ICD-10-CM | POA: Diagnosis not present

## 2015-10-12 DIAGNOSIS — Z7901 Long term (current) use of anticoagulants: Secondary | ICD-10-CM | POA: Diagnosis not present

## 2015-10-12 DIAGNOSIS — M7989 Other specified soft tissue disorders: Secondary | ICD-10-CM | POA: Diagnosis not present

## 2015-10-12 DIAGNOSIS — I482 Chronic atrial fibrillation: Secondary | ICD-10-CM | POA: Diagnosis not present

## 2015-10-12 DIAGNOSIS — I4821 Permanent atrial fibrillation: Secondary | ICD-10-CM

## 2015-10-12 LAB — POCT INR: INR: 1.8

## 2015-10-12 MED ORDER — FUROSEMIDE 20 MG PO TABS: ORAL_TABLET | ORAL | Status: DC

## 2015-10-12 NOTE — Patient Instructions (Signed)
Your physician wants you to follow-up in: 1 YEAR WITH DR. Purvis SheffieldKONESWARAN You will receive a reminder letter in the mail two months in advance. If you don't receive a letter, please call our office to schedule the follow-up appointment.  Your physician has recommended you make the following change in your medication:   TAKE LASIX 20 MG ONCE DAILY FOR 3 DAYS  Your physician has requested that you have a lower or upper extremity arterial duplex. This test is an ultrasound of the arteries in the legs or arms. It looks at arterial blood flow in the legs and arms. Allow one hour for Lower and Upper Arterial scans. There are no restrictions or special instructions  Thank you for choosing [facility]!!

## 2015-10-12 NOTE — Progress Notes (Signed)
Patient ID: Tracy [name], female   DOB: 06/16/1926, 82 y.o.   MRN: [id number]       CARDIOLOGY CONSULT NOTE  Patient ID: Tracy [name] MRN: [id number] DOB/AGE: 80/04/1926 80 y.o.  Admit date: (Not on file) Primary Physician: No primary care provider on file. Referring Physician: Clevenger MD  Reason for Consultation: atrial fibrillation, HTN  HPI: The patient is an 82 year old woman with a history of atrial fibrillation and is on low-dose metoprolol and is anticoagulated with warfarin. She [facility].  I do not have a copy of her most recent echocardiogram but it reportedly demonstrated normal left ventricular systolic function and no significant valvular abnormalities.  She experiences mild palpitations with exertion which is alleviated with rest. She denies chest pain. She has had some right leg swelling since last week. There is some pain in her leg when she walks.   Allergies  Allergen Reactions  . Penicillins Shortness Of Breath, Swelling and Rash  . Tylenol With Codeine #3 [Acetaminophen-Codeine] Other (See Comments)    Nervousness  "jitters"    Current Outpatient Prescriptions  Medication Sig Dispense Refill  . bimatoprost (LUMIGAN) 0.01 % SOLN Place 1 drop into both eyes at bedtime.    . metoprolol tartrate (LOPRESSOR) 25 MG tablet Take 12.5 mg by mouth 2 (two) times daily.    Marland Kitchen. warfarin (COUMADIN) 5 MG tablet Take 5 mg by mouth daily. Except hold 2 days per week     No current facility-administered medications for this visit.    Past Medical History  Diagnosis Date  . Shortness of breath   . Chronic kidney disease     hx kidney infection    Past Surgical History  Procedure Laterality Date  . Hernia repair      umbilical  . Appendectomy    . Cataract extraction w/phaco Right 08/28/2012    Procedure: CATARACT EXTRACTION PHACO AND INTRAOCULAR LENS PLACEMENT (IOC);  Surgeon: Chalmers Guestoy Whitaker, MD;  [facility];  Service:  Ophthalmology;  Laterality: Right;  . Tubal ligation      Social History   Social History  . Marital Status: Widowed    Spouse Name: N/A  . Number of Children: N/A  . Years of Education: N/A   Occupational History  . Not on file.   Social History Main Topics  . Smoking status: Never Smoker   . Smokeless tobacco: Never Used  . Alcohol Use: No  . Drug Use: No  . Sexual Activity: No   Other Topics Concern  . Not on file   Social History Narrative     No family history of premature CAD in 1st degree relatives.  Prior to Admission medications   Medication Sig Start Date End Date Taking? Authorizing Provider  bimatoprost (LUMIGAN) 0.01 % SOLN Place 1 drop into both eyes at bedtime.   Yes Historical Provider, MD  metoprolol tartrate (LOPRESSOR) 25 MG tablet Take 12.5 mg by mouth 2 (two) times daily.   Yes Historical Provider, MD  warfarin (COUMADIN) 5 MG tablet Take 5 mg by mouth daily. Except hold 2 days per week   Yes Historical Provider, MD     Review of systems complete and found to be negative unless listed above in HPI     Physical exam Blood pressure 181/80, pulse 74, height 5\' 2"  (1.575 m), weight 153 lb (69.4 kg). General: NAD Neck: No JVD, no thyromegaly or thyroid nodule.  Lungs: Clear to auscultation bilaterally with normal respiratory effort. CV:  Nondisplaced PMI. Regular rate and irregular rhythm, normal S1/S2, no S3, no murmur.  1+ right lower extremity edema.    Abdomen: Soft.  Skin: Intact without lesions or rashes.  Neurologic: Alert and oriented x 3.  Psych: Normal affect. Extremities: No clubbing or cyanosis.  HEENT: Hard of hearing.  ECG: Most recent ECG reviewed.  Labs:   Lab Results  Component Value Date   WBC 9.7 03/25/2013   HGB 16.3* 03/25/2013   HCT 48.7* 03/25/2013   MCV 94.7 03/25/2013   PLT 394 03/25/2013   No results for input(s): NA, K, CL, CO2, BUN, CREATININE, CALCIUM, PROT, BILITOT, ALKPHOS, ALT, AST, GLUCOSE in the last  168 hours.  Invalid input(s): LABALBU Lab Results  Component Value Date   TROPONINI <0.30 03/25/2013   No results found for: CHOL No results found for: HDL No results found for: LDLCALC No results found for: TRIG No results found for: CHOLHDL No results found for: LDLDIRECT       Studies: No results found.  ASSESSMENT AND PLAN:  1. Atrial fibrillation: HR controlled. Anticoagulated with warfarin.  2. Right leg swelling: Will obtain Dopplers to exclude DVT. Will prescribe Lasix 20 mg daily x 3 days.  Dispo: fu 1 year.   Signed: Prentice DockerSuresh Decklan Mau, M.D., F.A.C.C.  10/12/2015, 10:35 AM

## 2015-10-14 ENCOUNTER — Emergency Department (HOSPITAL_COMMUNITY): Payer: Medicare PPO

## 2015-10-14 ENCOUNTER — Observation Stay (HOSPITAL_COMMUNITY)
Admission: EM | Admit: 2015-10-14 | Discharge: 2015-10-15 | Disposition: A | Discharge status: Home / Self Care | Payer: Medicare PPO | Attending: Internal Medicine | Admitting: Internal Medicine

## 2015-10-14 ENCOUNTER — Encounter (HOSPITAL_COMMUNITY): Payer: Self-pay

## 2015-10-14 DIAGNOSIS — I10 Essential (primary) hypertension: Secondary | ICD-10-CM | POA: Diagnosis not present

## 2015-10-14 DIAGNOSIS — R4182 Altered mental status, unspecified: Secondary | ICD-10-CM | POA: Diagnosis present

## 2015-10-14 DIAGNOSIS — R600 Localized edema: Secondary | ICD-10-CM | POA: Diagnosis not present

## 2015-10-14 DIAGNOSIS — I639 Cerebral infarction, unspecified: Secondary | ICD-10-CM

## 2015-10-14 DIAGNOSIS — I4891 Unspecified atrial fibrillation: Secondary | ICD-10-CM | POA: Diagnosis not present

## 2015-10-14 DIAGNOSIS — R0602 Shortness of breath: Secondary | ICD-10-CM | POA: Diagnosis not present

## 2015-10-14 DIAGNOSIS — N189 Chronic kidney disease, unspecified: Secondary | ICD-10-CM | POA: Insufficient documentation

## 2015-10-14 DIAGNOSIS — Z79899 Other long term (current) drug therapy: Secondary | ICD-10-CM | POA: Diagnosis not present

## 2015-10-14 DIAGNOSIS — Z7901 Long term (current) use of anticoagulants: Secondary | ICD-10-CM

## 2015-10-14 DIAGNOSIS — G459 Transient cerebral ischemic attack, unspecified: Principal | ICD-10-CM | POA: Diagnosis present

## 2015-10-14 DIAGNOSIS — I129 Hypertensive chronic kidney disease with stage 1 through stage 4 chronic kidney disease, or unspecified chronic kidney disease: Secondary | ICD-10-CM | POA: Diagnosis not present

## 2015-10-14 DIAGNOSIS — I482 Chronic atrial fibrillation: Secondary | ICD-10-CM | POA: Diagnosis not present

## 2015-10-14 HISTORY — DX: Unspecified abdominal hernia without obstruction or gangrene: K46.9

## 2015-10-14 HISTORY — DX: Essential (primary) hypertension: I10

## 2015-10-14 HISTORY — DX: Unspecified atrial fibrillation: I48.91

## 2015-10-14 LAB — URINE MICROSCOPIC-ADD ON: RBC / HPF: NONE SEEN RBC/hpf (ref 0–5)

## 2015-10-14 LAB — COMPREHENSIVE METABOLIC PANEL
ALK PHOS: 93 U/L (ref 38–126)
ALT: 20 U/L (ref 14–54)
ANION GAP: 8 (ref 5–15)
AST: 26 U/L (ref 15–41)
Albumin: 3.7 g/dL (ref 3.5–5.0)
BUN: 22 mg/dL — ABNORMAL HIGH (ref 6–20)
CALCIUM: 8.9 mg/dL (ref 8.9–10.3)
CHLORIDE: 100 mmol/L — AB (ref 101–111)
CO2: 28 mmol/L (ref 22–32)
Creatinine, Ser: 1.17 mg/dL — ABNORMAL HIGH (ref 0.44–1.00)
GFR calc Af Amer: 47 mL/min — ABNORMAL LOW (ref >60)
GFR, EST NON AFRICAN AMERICAN: 41 mL/min — AB (ref >60)
GLUCOSE: 117 mg/dL — AB (ref 65–99)
POTASSIUM: 3.6 mmol/L (ref 3.5–5.1)
Sodium: 136 mmol/L (ref 135–145)
Total Bilirubin: 0.8 mg/dL (ref 0.3–1.2)
Total Protein: 6.9 g/dL (ref 6.5–8.1)

## 2015-10-14 LAB — BRAIN NATRIURETIC PEPTIDE: B NATRIURETIC PEPTIDE 5: 159 pg/mL — AB (ref 0.0–100.0)

## 2015-10-14 LAB — CBC
HEMATOCRIT: 47.4 % — AB (ref 36.0–46.0)
HEMOGLOBIN: 15.6 g/dL — AB (ref 12.0–15.0)
MCH: 29.5 pg (ref 26.0–34.0)
MCHC: 32.9 g/dL (ref 30.0–36.0)
MCV: 89.8 fL (ref 78.0–100.0)
Platelets: 292 10*3/uL (ref 150–400)
RBC: 5.28 MIL/uL — AB (ref 3.87–5.11)
RDW: 15.1 % (ref 11.5–15.5)
WBC: 7.6 10*3/uL (ref 4.0–10.5)

## 2015-10-14 LAB — APTT: APTT: 36 s (ref 24–37)

## 2015-10-14 LAB — DIFFERENTIAL
BASOS ABS: 0.1 10*3/uL (ref 0.0–0.1)
Basophils Relative: 1 %
EOS ABS: 0.5 10*3/uL (ref 0.0–0.7)
EOS PCT: 7 %
LYMPHS PCT: 18 %
Lymphs Abs: 1.4 10*3/uL (ref 0.7–4.0)
Monocytes Absolute: 0.7 10*3/uL (ref 0.1–1.0)
Monocytes Relative: 9 %
NEUTROS PCT: 66 %
Neutro Abs: 5.1 10*3/uL (ref 1.7–7.7)

## 2015-10-14 LAB — URINALYSIS, ROUTINE W REFLEX MICROSCOPIC
Bilirubin Urine: NEGATIVE
Glucose, UA: NEGATIVE mg/dL
Hgb urine dipstick: NEGATIVE
Ketones, ur: NEGATIVE mg/dL
NITRITE: NEGATIVE
PH: 6 (ref 5.0–8.0)
Protein, ur: NEGATIVE mg/dL
SPECIFIC GRAVITY, URINE: 1.01 (ref 1.005–1.030)

## 2015-10-14 LAB — I-STAT TROPONIN, ED: TROPONIN I, POC: 0.01 ng/mL (ref 0.00–0.08)

## 2015-10-14 LAB — I-STAT CHEM 8, ED
BUN: 23 mg/dL — AB (ref 6–20)
CALCIUM ION: 1.15 mmol/L (ref 1.12–1.23)
CHLORIDE: 97 mmol/L — AB (ref 101–111)
Creatinine, Ser: 1.2 mg/dL — ABNORMAL HIGH (ref 0.44–1.00)
GLUCOSE: 105 mg/dL — AB (ref 65–99)
HCT: 50 % — ABNORMAL HIGH (ref 36.0–46.0)
Hemoglobin: 17 g/dL — ABNORMAL HIGH (ref 12.0–15.0)
POTASSIUM: 3.8 mmol/L (ref 3.5–5.1)
Sodium: 140 mmol/L (ref 135–145)
TCO2: 30 mmol/L (ref 0–100)

## 2015-10-14 LAB — CBG MONITORING, ED: GLUCOSE-CAPILLARY: 140 mg/dL — AB (ref 65–99)

## 2015-10-14 LAB — RAPID URINE DRUG SCREEN, HOSP PERFORMED
Amphetamines: NOT DETECTED
BENZODIAZEPINES: NOT DETECTED
Barbiturates: NOT DETECTED
Cocaine: NOT DETECTED
OPIATES: NOT DETECTED
Tetrahydrocannabinol: NOT DETECTED

## 2015-10-14 LAB — PROTIME-INR
INR: 2.05 — ABNORMAL HIGH (ref 0.00–1.49)
Prothrombin Time: 23 seconds — ABNORMAL HIGH (ref 11.6–15.2)

## 2015-10-14 LAB — ETHANOL

## 2015-10-14 MED ORDER — ACETAMINOPHEN 650 MG RE SUPP: 650.0000 mg | RECTAL | Status: DC | PRN

## 2015-10-14 MED ORDER — ASPIRIN 325 MG PO TABS
325.0000 mg | ORAL_TABLET | Freq: Every day | ORAL | Status: DC
  Administered 2015-10-14 – 2015-10-15 (×2): 325 mg via ORAL
  Filled 2015-10-14 (×2): qty 1

## 2015-10-14 MED ORDER — FUROSEMIDE 20 MG PO TABS
20.0000 mg | ORAL_TABLET | Freq: Every day | ORAL | Status: DC
  Administered 2015-10-15: 20 mg via ORAL
  Filled 2015-10-14: qty 1

## 2015-10-14 MED ORDER — WARFARIN SODIUM 5 MG PO TABS: 2.5000 mg | ORAL_TABLET | Freq: Every day | ORAL | Status: DC

## 2015-10-14 MED ORDER — SENNOSIDES-DOCUSATE SODIUM 8.6-50 MG PO TABS: 1.0000 | ORAL_TABLET | Freq: Every evening | ORAL | Status: DC | PRN

## 2015-10-14 MED ORDER — METOPROLOL TARTRATE 25 MG PO TABS: 12.5000 mg | ORAL_TABLET | Freq: Two times a day (BID) | ORAL | Status: DC

## 2015-10-14 MED ORDER — WARFARIN SODIUM 2.5 MG PO TABS
2.5000 mg | ORAL_TABLET | Freq: Once | ORAL | Status: AC
  Administered 2015-10-14: 2.5 mg via ORAL
  Filled 2015-10-14: qty 1

## 2015-10-14 MED ORDER — ACETAMINOPHEN 325 MG PO TABS: 650.0000 mg | ORAL_TABLET | ORAL | Status: DC | PRN

## 2015-10-14 MED ORDER — STROKE: EARLY STAGES OF RECOVERY BOOK
Freq: Once | Status: DC
  Filled 2015-10-14: qty 1

## 2015-10-14 MED ORDER — WARFARIN - PHARMACIST DOSING INPATIENT
Status: DC
  Administered 2015-10-15: 16:00:00

## 2015-10-14 MED ORDER — LATANOPROST 0.005 % OP SOLN
1.0000 [drp] | Freq: Every day | OPHTHALMIC | Status: DC
  Filled 2015-10-14: qty 2.5

## 2015-10-14 MED ORDER — ASPIRIN 300 MG RE SUPP: 300.0000 mg | Freq: Every day | RECTAL | Status: DC

## 2015-10-14 NOTE — Consult Note (Signed)
[place] A. Merlene Laughter, MD     [email or web address]          Tracy [name] is an 82 y.o. female.   ASSESSMENT/PLAN: 1. Acute left hemispheric TIA this and no chronic atrial fibrillation. The patient INR is therapeutic however. I did discuss the case with the patient and we may want to consider wanted new or agents. The case is also discussed with the patient's son. They're going to do have further discussions among himself and the back with [place] on this. The other option would be to continue with warfarin therapy. I do not believe added aspirin will provide additional benefit at this time.  The patient 82 year old white female who presents with acute onset of word finding difficulties lasting for about 15 minutes. The patient does not report having focal numbness or weakness. She denies chest pain, shortness of breath, headaches, GI GU symptoms. She reports that she is feeling well at this time and and is at baseline. The son reports that her INR has been therapeutic. She has been on warfarin for about a year. The last INR check a week ago per the son was 1.8. Patient has a large abdominal hernia but has no significant issues from this. The review systems otherwise negative.  GENERAL: This is a very pleasant female in no acute distress.  HEENT: Normal  ABDOMEN: Very large soft abdominal hernia.  EXTREMITIES: No edema   BACK: Normal  SKIN: Normal by inspection.    MENTAL STATUS: Alert and oriented - she is appropriately oriented including age and the year and month. Speech, language and cognition are generally intact. Judgment and insight normal.   CRANIAL NERVES: Pupils are equal, round and reactive to light and accomodation; extra ocular movements are full, there is no significant nystagmus; visual fields are full; upper and lower facial muscles are normal in strength and symmetric, there is no flattening of the nasolabial folds; tongue is midline; uvula is midline;  shoulder elevation is normal.  MOTOR: Normal tone, bulk and strength; no pronator drift. No drift of the upper lower extremities.  COORDINATION: Left finger to nose is normal, right finger to nose is normal, No rest tremor; no intention tremor; no postural tremor; no bradykinesia.  REFLEXES: Deep tendon reflexes are symmetrical and normal. Babinski reflexes are equivocal bilaterally.   SENSATION: Normal to light touch, temperature, and pinprick.       Blood pressure 152/95, pulse 68, temperature 97.3 F (36.3 C), temperature source Oral, resp. rate 18, height '5\' 2"'  (1.575 m), weight 156 lb 11.2 oz (71.079 kg), SpO2 96 %.  Past Medical History  Diagnosis Date  . Shortness of breath   . Chronic kidney disease     hx kidney infection  . A-fib ([place])   . Hypertension   . Hernia, abdominal     Past Surgical History  Procedure Laterality Date  . Hernia repair      umbilical  . Appendectomy    . Cataract extraction w/phaco Right 08/28/2012    Procedure: CATARACT EXTRACTION PHACO AND INTRAOCULAR LENS PLACEMENT (IOC);  Surgeon: Marylynn Pearson, MD;  Location: [facility];  Service: Ophthalmology;  Laterality: Right;  . Tubal ligation      Family History  Problem Relation Age of Onset  . Heart disease Mother   . Heart disease Sister   . Heart attack Mother   . Heart failure Brother   . Stroke Brother   . Cancer Sister     Social History:  reports  that she has never smoked. She has never used smokeless tobacco. She reports that she does not drink alcohol or use illicit drugs.  Allergies:  Allergies  Allergen Reactions  . Penicillins Shortness Of Breath, Swelling and Rash    Medications: Prior to Admission medications   Medication Sig Start Date End Date Taking? Authorizing Provider  bimatoprost (LUMIGAN) 0.01 % SOLN Place 1 drop into both eyes at bedtime.   Yes Historical Provider, MD  furosemide (LASIX) 20 MG tablet TAKE 1 TAB FOR 3 DAYS 10/12/15  Yes Herminio Commons, MD    metoprolol tartrate (LOPRESSOR) 25 MG tablet Take 12.5 mg by mouth 2 (two) times daily.   Yes Historical Provider, MD  warfarin (COUMADIN) 5 MG tablet Take 2.5 mg by mouth daily.    Yes Historical Provider, MD    Scheduled Meds: .  stroke: mapping our early stages of recovery book   Does not apply Once  . aspirin  300 mg Rectal Daily   Or  . aspirin  325 mg Oral Daily  . [START ON 10/15/2015] furosemide  20 mg Oral Daily  . latanoprost  1 drop Both Eyes QHS  . metoprolol tartrate  12.5 mg Oral BID  . warfarin  2.5 mg Oral Once  . [START ON 10/15/2015] Warfarin - Pharmacist Dosing Inpatient   Does not apply Q24H   Continuous Infusions:  PRN Meds:.acetaminophen **OR** acetaminophen, senna-docusate     Results for orders placed or performed during the hospital encounter of 10/14/15 (from the past 48 hour(s))  Comprehensive metabolic panel     Status: Abnormal   Collection Time: 10/14/15 12:56 PM  Result Value Ref Range   Sodium 136 135 - 145 mmol/L   Potassium 3.6 3.5 - 5.1 mmol/L   Chloride 100 (L) 101 - 111 mmol/L   CO2 28 22 - 32 mmol/L   Glucose, Bld 117 (H) 65 - 99 mg/dL   BUN 22 (H) 6 - 20 mg/dL   Creatinine, Ser 1.17 (H) 0.44 - 1.00 mg/dL   Calcium 8.9 8.9 - 10.3 mg/dL   Total Protein 6.9 6.5 - 8.1 g/dL   Albumin 3.7 3.5 - 5.0 g/dL   AST 26 15 - 41 U/L   ALT 20 14 - 54 U/L   Alkaline Phosphatase 93 38 - 126 U/L   Total Bilirubin 0.8 0.3 - 1.2 mg/dL   GFR calc non Af Amer 41 (L) >60 mL/min   GFR calc Af Amer 47 (L) >60 mL/min    Comment: (NOTE) The eGFR has been calculated using the CKD EPI equation. This calculation has not been validated in all clinical situations. eGFR's persistently <60 mL/min signify possible Chronic Kidney Disease.    Anion gap 8 5 - 15  CBC     Status: Abnormal   Collection Time: 10/14/15 12:56 PM  Result Value Ref Range   WBC 7.6 4.0 - 10.5 K/uL   RBC 5.28 (H) 3.87 - 5.11 MIL/uL   Hemoglobin 15.6 (H) 12.0 - 15.0 g/dL   HCT 47.4 (H)  36.0 - 46.0 %   MCV 89.8 78.0 - 100.0 fL   MCH 29.5 26.0 - 34.0 pg   MCHC 32.9 30.0 - 36.0 g/dL   RDW 15.1 11.5 - 15.5 %   Platelets 292 150 - 400 K/uL  Ethanol     Status: None   Collection Time: 10/14/15 12:56 PM  Result Value Ref Range   Alcohol, Ethyl (B) <5 <5 mg/dL    Comment:  LOWEST DETECTABLE LIMIT FOR SERUM ALCOHOL IS 5 mg/dL FOR MEDICAL PURPOSES ONLY   Protime-INR     Status: Abnormal   Collection Time: 10/14/15 12:56 PM  Result Value Ref Range   Prothrombin Time 23.0 (H) 11.6 - 15.2 seconds   INR 2.05 (H) 0.00 - 1.49  APTT     Status: None   Collection Time: 10/14/15 12:56 PM  Result Value Ref Range   aPTT 36 24 - 37 seconds  Differential     Status: None   Collection Time: 10/14/15 12:56 PM  Result Value Ref Range   Neutrophils Relative % 66 %   Neutro Abs 5.1 1.7 - 7.7 K/uL   Lymphocytes Relative 18 %   Lymphs Abs 1.4 0.7 - 4.0 K/uL   Monocytes Relative 9 %   Monocytes Absolute 0.7 0.1 - 1.0 K/uL   Eosinophils Relative 7 %   Eosinophils Absolute 0.5 0.0 - 0.7 K/uL   Basophils Relative 1 %   Basophils Absolute 0.1 0.0 - 0.1 K/uL  CBG monitoring, ED     Status: Abnormal   Collection Time: 10/14/15  1:08 PM  Result Value Ref Range   Glucose-Capillary 140 (H) 65 - 99 mg/dL  Urine rapid drug screen (hosp performed)not at [facility]     Status: None   Collection Time: 10/14/15  1:45 PM  Result Value Ref Range   Opiates NONE DETECTED NONE DETECTED   Cocaine NONE DETECTED NONE DETECTED   Benzodiazepines NONE DETECTED NONE DETECTED   Amphetamines NONE DETECTED NONE DETECTED   Tetrahydrocannabinol NONE DETECTED NONE DETECTED   Barbiturates NONE DETECTED NONE DETECTED    Comment:        DRUG SCREEN FOR MEDICAL PURPOSES ONLY.  IF CONFIRMATION IS NEEDED FOR ANY PURPOSE, NOTIFY LAB WITHIN 5 DAYS.        LOWEST DETECTABLE LIMITS FOR URINE DRUG SCREEN Drug Class       Cutoff (ng/mL) Amphetamine      1000 Barbiturate      200 Benzodiazepine   650 Tricyclics        354 Opiates          300 Cocaine          300 THC              50   Urinalysis, Routine w reflex microscopic (not at [facility])     Status: Abnormal   Collection Time: 10/14/15  1:45 PM  Result Value Ref Range   Color, Urine YELLOW YELLOW   APPearance CLEAR CLEAR   Specific Gravity, Urine 1.010 1.005 - 1.030   pH 6.0 5.0 - 8.0   Glucose, UA NEGATIVE NEGATIVE mg/dL   Hgb urine dipstick NEGATIVE NEGATIVE   Bilirubin Urine NEGATIVE NEGATIVE   Ketones, ur NEGATIVE NEGATIVE mg/dL   Protein, ur NEGATIVE NEGATIVE mg/dL   Nitrite NEGATIVE NEGATIVE   Leukocytes, UA SMALL (A) NEGATIVE  Urine microscopic-add on     Status: Abnormal   Collection Time: 10/14/15  1:45 PM  Result Value Ref Range   Squamous Epithelial / LPF 6-30 (A) NONE SEEN   WBC, UA 6-30 0 - 5 WBC/hpf   RBC / HPF NONE SEEN 0 - 5 RBC/hpf   Bacteria, UA FEW (A) NONE SEEN  I-stat troponin, ED (not at [facility], [facility])     Status: None   Collection Time: 10/14/15  1:49 PM  Result Value Ref Range   Troponin i, poc 0.01 0.00 - 0.08 ng/mL   Comment 3  Comment: Due to the release kinetics of cTnI, a negative result within the first hours of the onset of symptoms does not rule out myocardial infarction with certainty. If myocardial infarction is still suspected, repeat the test at appropriate intervals.   I-Stat Chem 8, ED  (not at [facility], [facility])     Status: Abnormal   Collection Time: 10/14/15  1:51 PM  Result Value Ref Range   Sodium 140 135 - 145 mmol/L   Potassium 3.8 3.5 - 5.1 mmol/L   Chloride 97 (L) 101 - 111 mmol/L   BUN 23 (H) 6 - 20 mg/dL   Creatinine, Ser 1.20 (H) 0.44 - 1.00 mg/dL   Glucose, Bld 105 (H) 65 - 99 mg/dL   Calcium, Ion 1.15 1.12 - 1.23 mmol/L   TCO2 30 0 - 100 mmol/L   Hemoglobin 17.0 (H) 12.0 - 15.0 g/dL   HCT 50.0 (H) 36.0 - 46.0 %    Studies/Results:     Sabastion Hrdlicka A. Merlene [name], M.D.  Diplomate, Tax adviser of Psychiatry and Neurology ( Neurology). 10/14/2015, 7:58 PM

## 2015-10-14 NOTE — Progress Notes (Signed)
Code stroke beeper 1330 Patient in CT 1340 Scan finished and patient back to ER 1350/ called GRA

## 2015-10-14 NOTE — Progress Notes (Signed)
ANTICOAGULATION CONSULT NOTE - Initial Consult  Pharmacy Consult for Pharmacy Indication: atrial fibrillation  Allergies  Allergen Reactions  . Penicillins Shortness Of Breath, Swelling and Rash    Has patient had a PCN reaction causing immediate rash, facial/tongue/throat swelling, SOB or lightheadedness with hypotension: Yesyes Has patient had a PCN reaction causing severe rash involving mucus membranes or skin necrosis: Nono Has patient had a PCN reaction that required hospitalization Nono Has patient had a PCN reaction occurring within the last 10 years:  If all of the above answers are "NO", then may proceed with Cephalosporin u  . Tylenol With Codeine #3 [Acetaminophen-Codeine] Other (See Comments)    Nervousness  "jitters"    Patient Measurements: Height: 5\' 2"  (157.5 cm) Weight: 156 lb 11.2 oz (71.079 kg) IBW/kg (Calculated) : 50.1  Vital Signs: Temp: 97.3 F (36.3 C) (06/29 1845) Temp Source: Oral (06/29 1845) BP: 152/95 mmHg (06/29 1900) Pulse Rate: 68 (06/29 1845)  Labs:  Recent Labs  10/12/15 0814 10/14/15 1256 10/14/15 1351  HGB  --  15.6* 17.0*  HCT  --  47.4* 50.0*  PLT  --  292  --   APTT  --  36  --   LABPROT  --  23.0*  --   INR 1.8 2.05*  --   CREATININE  --  1.17* 1.20*    Estimated Creatinine Clearance: 31.1 mL/min (by C-G formula based on Cr of 1.2).   Medical History: Past Medical History  Diagnosis Date  . Shortness of breath   . Chronic kidney disease     hx kidney infection  . A-fib ([facility])   . Hypertension   . Hernia, abdominal     Medications:  Prescriptions prior to admission  Medication Sig Dispense Refill Last Dose  . bimatoprost (LUMIGAN) 0.01 % SOLN Place 1 drop into both eyes at bedtime.   10/13/2015 at Unknown time  . furosemide (LASIX) 20 MG tablet TAKE 1 TAB FOR 3 DAYS 3 tablet 0 10/14/2015 at Unknown time  . metoprolol tartrate (LOPRESSOR) 25 MG tablet Take 12.5 mg by mouth 2 (two) times daily.   10/14/2015 at 0830  .  warfarin (COUMADIN) 5 MG tablet Take 2.5 mg by mouth daily.    10/13/2015 at 1700    Assessment: Okay for Protocol, INR at goal.  Last dose 6/28 per med list.  Goal of Therapy:  INR 2-3   Plan:  Warfarin 2.5mg  PO today Daily PT/INR Monitor for signs and symptoms of bleeding.   Mady GemmaHayes, Yvonda Fouty R 10/14/2015,7:05 PM

## 2015-10-14 NOTE — ED Notes (Signed)
Family reports patient had episode of confusion today at 1100 that last approx. 15 minutes then resolved. Patient denies pain or other symptoms. PAtient a&ox4. No defficts noted.

## 2015-10-14 NOTE — H&P (Signed)
History and Physical    Tracy [name] NWG:[id number] DOB: 01/01/1927 DOA: 10/14/2015  PCP: No PCP Per Patient  Patient coming from: home  Chief Complaint: difficulty speaking  HPI: Tracy [name] is a 82 y.o. female with medical history significant of atrial fibrillation on anticoagulation, was in her usual state of health this morning when suddenly she developed onset of aphasia. She was having difficulty putting her words together and speaking coherently. She reports this episodes lasted approximately 15 mins and resolved on its own. She denied any difficulty walking or limb weakness. She denies changes in vision or difficulty swallowing. She has not had any numbness or tingling or chest pain. When her son discovered that she was having these symptoms, he brought her to the ED for further evaluation.  ED Course: in the ED, it was noted that her symptoms had resolved. Code stroke was called, but patient did not appear to be a candidate for tPA since her symptoms were minor and improving, and she is also anticoagulated. She is being referred for further work up of TIA  Review of Systems: As per HPI otherwise 10 point review of systems negative.    Past Medical History  Diagnosis Date  . Shortness of breath   . Chronic kidney disease     hx kidney infection  . A-fib (HCC)   . Hypertension   . Hernia, abdominal     Past Surgical History  Procedure Laterality Date  . Hernia repair      umbilical  . Appendectomy    . Cataract extraction w/phaco Right 08/28/2012    Procedure: CATARACT EXTRACTION PHACO AND INTRAOCULAR LENS PLACEMENT (IOC);  Surgeon: Chalmers Guestoy Whitaker, MD;  [facility];  Service: Ophthalmology;  Laterality: Right;  . Tubal ligation       reports that she has never smoked. She has never used smokeless tobacco. She reports that she does not drink alcohol or use illicit drugs.  Allergies  Allergen Reactions  . Penicillins Shortness Of Breath, Swelling and Rash     Family History  Problem Relation Age of Onset  . Heart disease Mother   . Heart disease Sister   . Heart attack Mother   . Heart failure Brother   . Stroke Brother   . Cancer Sister     Prior to Admission medications   Medication Sig Start Date End Date Taking? Authorizing Provider  bimatoprost (LUMIGAN) 0.01 % SOLN Place 1 drop into both eyes at bedtime.   Yes Historical Provider, MD  furosemide (LASIX) 20 MG tablet TAKE 1 TAB FOR 3 DAYS 10/12/15  Yes Laqueta LindenSuresh A Koneswaran, MD  metoprolol tartrate (LOPRESSOR) 25 MG tablet Take 12.5 mg by mouth 2 (two) times daily.   Yes Historical Provider, MD  warfarin (COUMADIN) 5 MG tablet Take 2.5 mg by mouth daily.    Yes Historical Provider, MD    Physical Exam: Filed Vitals:   10/14/15 1500 10/14/15 1515 10/14/15 1600 10/14/15 1645  BP: 166/66 166/66 166/71 163/83  Pulse: 64 66 59 51  Temp:    97.5 F (36.4 C)  TempSrc:    Oral  Resp: 21 18 16 18   Height:    5\' 2"  (1.575 m)  Weight:    71.079 kg (156 lb 11.2 oz)  SpO2: 89% 99% 90% 100%      Constitutional: NAD, calm, comfortable Filed Vitals:   10/14/15 1500 10/14/15 1515 10/14/15 1600 10/14/15 1645  BP: 166/66 166/66 166/71 163/83  Pulse: 64 66 59 51  Temp:    97.5 F (36.4 C)  TempSrc:    Oral  Resp: 21 18 16 18   Height:    5\' 2"  (1.575 m)  Weight:    71.079 kg (156 lb 11.2 oz)  SpO2: 89% 99% 90% 100%   Eyes: PERRL, lids and conjunctivae normal ENMT: Mucous membranes are moist. Posterior pharynx clear of any exudate or lesions.Normal dentition.  Neck: normal, supple, no masses, no thyromegaly Respiratory: clear to auscultation bilaterally, no wheezing, no crackles. Normal respiratory effort. No accessory muscle use.  Cardiovascular: irregular rate and rhythm, no murmurs / rubs / gallops. 1-2+ extremity edema bilaterally. 2+ pedal pulses. No carotid bruits.  Abdomen: no tenderness, no masses palpated. No hepatosplenomegaly. Bowel sounds positive.  Musculoskeletal: no  clubbing / cyanosis. No joint deformity upper and lower extremities. Good ROM, no contractures. Normal muscle tone.  Skin: no rashes, lesions, ulcers. No induration Neurologic: CN 2-12 grossly intact. Sensation intact, DTR normal. Strength 5/5 in all 4.  Psychiatric: Normal judgment and insight. Alert and oriented x 3. Normal mood.    Labs on Admission: I have personally reviewed following labs and imaging studies  CBC:  Recent Labs Lab 10/14/15 1256 10/14/15 1351  WBC 7.6  --   NEUTROABS 5.1  --   HGB 15.6* 17.0*  HCT 47.4* 50.0*  MCV 89.8  --   PLT 292  --    Basic Metabolic Panel:  Recent Labs Lab 10/14/15 1256 10/14/15 1351  NA 136 140  K 3.6 3.8  CL 100* 97*  CO2 28  --   GLUCOSE 117* 105*  BUN 22* 23*  CREATININE 1.17* 1.20*  CALCIUM 8.9  --    GFR: Estimated Creatinine Clearance: 31.1 mL/min (by C-G formula based on Cr of 1.2). Liver Function Tests:  Recent Labs Lab 10/14/15 1256  AST 26  ALT 20  ALKPHOS 93  BILITOT 0.8  PROT 6.9  ALBUMIN 3.7   No results for input(s): LIPASE, AMYLASE in the last 168 hours. No results for input(s): AMMONIA in the last 168 hours. Coagulation Profile:  Recent Labs Lab 10/12/15 0814 10/14/15 1256  INR 1.8 2.05*   Cardiac Enzymes: No results for input(s): CKTOTAL, CKMB, CKMBINDEX, TROPONINI in the last 168 hours. BNP (last 3 results) No results for input(s): PROBNP in the last 8760 hours. HbA1C: No results for input(s): HGBA1C in the last 72 hours. CBG:  Recent Labs Lab 10/14/15 1308  GLUCAP 140*   Lipid Profile: No results for input(s): CHOL, HDL, LDLCALC, TRIG, CHOLHDL, LDLDIRECT in the last 72 hours. Thyroid Function Tests: No results for input(s): TSH, T4TOTAL, FREET4, T3FREE, THYROIDAB in the last 72 hours. Anemia Panel: No results for input(s): VITAMINB12, FOLATE, FERRITIN, TIBC, IRON, RETICCTPCT in the last 72 hours. Urine analysis:    Component Value Date/Time   COLORURINE YELLOW 10/14/2015  1345   APPEARANCEUR CLEAR 10/14/2015 1345   LABSPEC 1.010 10/14/2015 1345   PHURINE 6.0 10/14/2015 1345   GLUCOSEU NEGATIVE 10/14/2015 1345   HGBUR NEGATIVE 10/14/2015 1345   BILIRUBINUR NEGATIVE 10/14/2015 1345   KETONESUR NEGATIVE 10/14/2015 1345   PROTEINUR NEGATIVE 10/14/2015 1345   UROBILINOGEN 1.0 03/25/2013 1726   NITRITE NEGATIVE 10/14/2015 1345   LEUKOCYTESUR SMALL* 10/14/2015 1345   Sepsis Labs: !!!!!!!!!!!!!!!!!!!!!!!!!!!!!!!!!!!!!!!!!!!! @LABRCNTIP (procalcitonin:4,lacticidven:4) )No results found for this or any previous visit (from the past 240 hour(s)).   Radiological Exams on Admission: Ct Head Code Stroke W/o Cm  10/14/2015  CLINICAL DATA:  Aphasia and confusion starting at 11 a.m. EXAM:  CT HEAD WITHOUT CONTRAST TECHNIQUE: Contiguous axial images were obtained from the base of the skull through the vertex without intravenous contrast. COMPARISON:  None. FINDINGS: Brain: No evidence of acute infarction, hemorrhage, hydrocephalus, or mass lesion/mass effect. Normal appearance of the brain for age, including mild generalized cerebral volume loss and periventricular white matter gliosis.ASPECTS is 10. Vascular: No hyperdense vessel or unexpected calcification. Skull: Negative for fracture or focal lesion. Sinuses/Orbits: Symmetric enophthalmos presumably from paucity of fat. Cataract resection on the right. Other: These results were called by telephone at the time of interpretation on 10/14/2015 at 1:56 pm to Dr. Linwood Dibbles , who verbally acknowledged these results. IMPRESSION: No acute finding.  Age congruent senescent changes. Electronically Signed   By: Marnee Spring M.D.   On: 10/14/2015 13:58    EKG: Independently reviewed. A fib  Assessment/Plan Active Problems:   Atrial fibrillation (HCC) [I48.91]   TIA (transient ischemic attack)   Chronic anticoagulation   Bilateral lower extremity edema   HTN (hypertension)    1. TIA. Patient had transient aphasia which appear  to have resolved. She does not have any further deficits at this time. UA appears unremarkable. She has not been started on any new medications. She will undergo further work up for TIA with MRI brain, carotid dopplers and echo. Will start on aspirin  2. Chronic A fib. CHADSVASc score of 6 for HTN(1), Age(2), TIA(2), Sex(1). Rate controlled and anticoagulated with coumadin.  3. Bilateral LE edema. Check BNP. Check venous dopplers. Recently started on lasix as an outpatient. Follow up Echo.  4. HTN. Continue metoprolol    DVT prophylaxis: coumadin Code Status: full code Family Communication: no family present Disposition Plan: discharge home once improved Consults called: neurology Admission status: observation, tele   MEMON,JEHANZEB MD [facility] Pager [phone]  If 7PM-7AM, please contact night-coverage [email or web address] Password [facility]  10/14/2015, 6:40 PM

## 2015-10-14 NOTE — ED Provider Notes (Signed)
CSN: [id number]     Arrival date & time 10/14/15  1240 History   First MD Initiated Contact with Patient 10/14/15 1302     Chief Complaint  Patient presents with  . Altered Mental Status     HPI Patient presented to the emergency room with complaints of acute onset of confusion and speech difficulty that started at 11 AM. Patient woke up this morning without any complaints. She felt fine. She had an appointment to have her hair done. Patient's friend states she was normal at the hairdressers. They're driving from the hairdressers to [employer] for lunch.  Between 11 AM and 11:15 patient started to hit some difficulty with finding her words. Certain words that she would say would not make any sense. The patient was also confused. She had ordered a specific meal for lunch but when they brought her to her she did not remember that she ordered it.  The symptoms seem to be improving although the patient's family has still noted that she occasionally will say the wrong word. She denies any trouble with numbness or weakness in her extremities. She denies any visual symptoms. Past Medical History  Diagnosis Date  . Shortness of breath   . Chronic kidney disease     hx kidney infection  . A-fib (HCC)   . Hypertension   . Hernia, abdominal    Past Surgical History  Procedure Laterality Date  . Hernia repair      umbilical  . Appendectomy    . Cataract extraction w/phaco Right 08/28/2012    Procedure: CATARACT EXTRACTION PHACO AND INTRAOCULAR LENS PLACEMENT (IOC);  Surgeon: Chalmers Guestoy Whitaker, MD;  [facility];  Service: Ophthalmology;  Laterality: Right;  . Tubal ligation     Family History  Problem Relation Age of Onset  . Heart disease Mother   . Heart disease Sister   . Heart attack Mother   . Heart failure Brother   . Stroke Brother   . Cancer Sister    Social History  Substance Use Topics  . Smoking status: Never Smoker   . Smokeless tobacco: Never Used  . Alcohol Use: No   OB  History    No data available     Review of Systems  All other systems reviewed and are negative.     Allergies  Penicillins and Tylenol with codeine #3  Home Medications   Prior to Admission medications   Medication Sig Start Date End Date Taking? Authorizing Provider  bimatoprost (LUMIGAN) 0.01 % SOLN Place 1 drop into both eyes at bedtime.   Yes Historical Provider, MD  furosemide (LASIX) 20 MG tablet TAKE 1 TAB FOR 3 DAYS 10/12/15  Yes Laqueta LindenSuresh A Koneswaran, MD  metoprolol tartrate (LOPRESSOR) 25 MG tablet Take 12.5 mg by mouth 2 (two) times daily.   Yes Historical Provider, MD  warfarin (COUMADIN) 5 MG tablet Take 2.5 mg by mouth daily. Except hold 2 days per week   Yes Historical Provider, MD   BP 179/95 mmHg  Pulse 61  Temp(Src) 98.3 F (36.8 C)  Resp 18  Ht 5\' 2"  (1.575 m)  Wt 69.4 kg  BMI 27.98 kg/m2  SpO2 100% Physical Exam  Constitutional: She is oriented to person, place, and time. She appears well-developed and well-nourished. No distress.  HENT:  Head: Normocephalic and atraumatic.  Right Ear: External ear normal.  Left Ear: External ear normal.  Mouth/Throat: Oropharynx is clear and moist. No oropharyngeal exudate.  Eyes: Conjunctivae are normal. Right  eye exhibits no discharge. Left eye exhibits no discharge. No scleral icterus.  Neck: Neck supple. No tracheal deviation present.  Cardiovascular: Normal rate, regular rhythm and intact distal pulses.   Pulmonary/Chest: Effort normal and breath sounds normal. No stridor. No respiratory distress. She has no wheezes. She has no rales.  Abdominal: Soft. Bowel sounds are normal. She exhibits no distension. There is no tenderness. There is no rebound and no guarding.  Musculoskeletal: She exhibits no edema or tenderness.  Neurological: She is alert and oriented to person, place, and time. She has normal strength. No cranial nerve deficit (no facial droop, extraocular movements intact, intermittent aphasia  (couldnt  find the word slurred, said "stirled") or sensory deficit. She exhibits normal muscle tone. She displays no seizure activity. Coordination normal.  No pronator drift bilateral upper extrem, able to hold both legs off bed for 5 seconds, sensation intact in all extremities, no visual field cuts, no left or right sided neglect, normal finger-nose exam bilaterally, no nystagmus noted   Skin: Skin is warm and dry. No rash noted.  Psychiatric: She has a normal mood and affect.  Nursing note and vitals reviewed.   ED Course  Procedures (including critical care time) Labs Review Labs Reviewed  COMPREHENSIVE METABOLIC PANEL - Abnormal; Notable for the following:    Chloride 100 (*)    Glucose, Bld 117 (*)    BUN 22 (*)    Creatinine, Ser 1.17 (*)    GFR calc non Af Amer 41 (*)    GFR calc Af Amer 47 (*)    All other components within normal limits  CBC - Abnormal; Notable for the following:    RBC 5.28 (*)    Hemoglobin 15.6 (*)    HCT 47.4 (*)    All other components within normal limits  PROTIME-INR - Abnormal; Notable for the following:    Prothrombin Time 23.0 (*)    INR 2.05 (*)    All other components within normal limits  CBG MONITORING, ED - Abnormal; Notable for the following:    Glucose-Capillary 140 (*)    All other components within normal limits  I-STAT CHEM 8, ED - Abnormal; Notable for the following:    Chloride 97 (*)    BUN 23 (*)    Creatinine, Ser 1.20 (*)    Glucose, Bld 105 (*)    Hemoglobin 17.0 (*)    HCT 50.0 (*)    All other components within normal limits  ETHANOL  APTT  DIFFERENTIAL  URINE RAPID DRUG SCREEN, HOSP PERFORMED  URINALYSIS, ROUTINE W REFLEX MICROSCOPIC (NOT [facility])  I-STAT TROPOININ, ED    Imaging Review Ct Head Code Stroke W/o Cm  10/14/2015  CLINICAL DATA:  Aphasia and confusion starting at 11 a.m. EXAM: CT HEAD WITHOUT CONTRAST TECHNIQUE: Contiguous axial images were obtained from the base of the skull through the vertex without  intravenous contrast. COMPARISON:  None. FINDINGS: Brain: No evidence of acute infarction, hemorrhage, hydrocephalus, or mass lesion/mass effect. Normal appearance of the brain for age, including mild generalized cerebral volume loss and periventricular white matter gliosis.ASPECTS is 10. Vascular: No hyperdense vessel or unexpected calcification. Skull: Negative for fracture or focal lesion. Sinuses/Orbits: Symmetric enophthalmos presumably from paucity of fat. Cataract resection on the right. Other: These results were called by telephone at the time of interpretation on 10/14/2015 at 1:56 pm to Dr. Linwood DibblesJON Janeah Kovacich , who verbally acknowledged these results. IMPRESSION: No acute finding.  Age congruent senescent changes. Electronically Signed  By: Marnee Spring M.D.   On: 10/14/2015 13:58   I have personally reviewed and evaluated these images and lab results as part of my medical decision-making.   EKG Interpretation   Date/Time:  Thursday October 14 2015 12:55:25 EDT Ventricular Rate:  75 PR Interval:    QRS Duration: 95 QT Interval:  444 QTC Calculation: 496 R Axis:   102 Text Interpretation:  Atrial fibrillation , poor data quality, ? a fib  Right axis deviation Borderline repolarization abnormality Borderline  prolonged QT interval Confirmed by Tramayne Sebesta  MD-J, Latacha Texeira ([id number]) on 10/14/2015  1:01:46 PM      MDM   Final diagnoses:  Stroke of unknown cause (HCC)  Transient cerebral ischemia, unspecified transient cerebral ischemia type    Code stroke activated Pt is not a TPA candidate.  INR is 2.0.  She is on coumadin for a fib.Her sx are also improving and only very mild at this point with intermittent word finding issues.   Initial labs are reassuring.  CT head is pending.  1442  CT scan is negative for bleed.  Pt was evaluated by neurology.  Agrees with no intervention at this time.   More than likely TIA rather than stroke.  Will admit for further workup.      Linwood Dibbles, MD 10/14/15  [phone]

## 2015-10-14 NOTE — ED Notes (Signed)
Pt ambulatory to bathroom with assistance, no complaints

## 2015-10-15 ENCOUNTER — Observation Stay (HOSPITAL_COMMUNITY): Payer: Medicare PPO

## 2015-10-15 ENCOUNTER — Observation Stay (HOSPITAL_BASED_OUTPATIENT_CLINIC_OR_DEPARTMENT_OTHER): Payer: Medicare PPO

## 2015-10-15 ENCOUNTER — Ambulatory Visit (HOSPITAL_COMMUNITY): Admit: 2015-10-15 | Payer: Medicare PPO

## 2015-10-15 DIAGNOSIS — Z7901 Long term (current) use of anticoagulants: Secondary | ICD-10-CM | POA: Diagnosis not present

## 2015-10-15 DIAGNOSIS — R600 Localized edema: Secondary | ICD-10-CM | POA: Diagnosis not present

## 2015-10-15 DIAGNOSIS — I10 Essential (primary) hypertension: Secondary | ICD-10-CM | POA: Diagnosis not present

## 2015-10-15 DIAGNOSIS — G459 Transient cerebral ischemic attack, unspecified: Secondary | ICD-10-CM

## 2015-10-15 DIAGNOSIS — I482 Chronic atrial fibrillation: Secondary | ICD-10-CM | POA: Diagnosis not present

## 2015-10-15 LAB — ECHOCARDIOGRAM COMPLETE
E decel time: 236 msec
EERAT: 14.96
FS: 38 % (ref 28–44)
HEIGHTINCHES: 62 in
IV/PV OW: 0.73
LA diam end sys: 35 mm
LASIZE: 35 mm
LAVOL: 56.2 mL
LAVOLA4C: 54.3 mL
LDCA: 3.14 cm2
LV E/e' medial: 14.96
LV E/e'average: 14.96
LV TDI E'MEDIAL: 7.51
LV e' LATERAL: 8.49 cm/s
LVOT diameter: 20 mm
MV Dec: 236
MVPG: 6 mmHg
MVPKAVEL: 53.3 m/s
MVPKEVEL: 127 m/s
PW: 12.6 mm — AB (ref 0.6–1.1)
TAPSE: 17.4 mm
TDI e' lateral: 8.49
WEIGHTICAEL: 2507.2 [oz_av]

## 2015-10-15 LAB — LIPID PANEL
Cholesterol: 193 mg/dL (ref 0–200)
HDL: 60 mg/dL (ref >40)
LDL CALC: 113 mg/dL — AB (ref 0–99)
Total CHOL/HDL Ratio: 3.2 RATIO
Triglycerides: 99 mg/dL (ref <150)
VLDL: 20 mg/dL (ref 0–40)

## 2015-10-15 LAB — PROTIME-INR
INR: 2.34 — ABNORMAL HIGH (ref 0.00–1.49)
Prothrombin Time: 25.4 seconds — ABNORMAL HIGH (ref 11.6–15.2)

## 2015-10-15 MED ORDER — WARFARIN SODIUM 5 MG PO TABS
2.5000 mg | ORAL_TABLET | Freq: Once | ORAL | Status: AC
  Administered 2015-10-15: 2.5 mg via ORAL
  Filled 2015-10-15: qty 1

## 2015-10-15 MED ORDER — ATORVASTATIN CALCIUM 10 MG PO TABS: 10.0000 mg | ORAL_TABLET | Freq: Every day | ORAL | Status: DC

## 2015-10-15 MED ORDER — ATORVASTATIN CALCIUM 10 MG PO TABS
10.0000 mg | ORAL_TABLET | Freq: Every day | ORAL | Status: DC
Start: 2015-10-15 — End: 2015-10-15
  Administered 2015-10-15: 10 mg via ORAL
  Filled 2015-10-15: qty 1

## 2015-10-15 NOTE — Progress Notes (Signed)
*  PRELIMINARY RESULTS* Echocardiogram 2D Echocardiogram has been performed.  Jeryl Columbialliott, Loda Bialas 10/15/2015, 1:30 PM

## 2015-10-15 NOTE — Care Management Obs Status (Signed)
MEDICARE OBSERVATION STATUS NOTIFICATION   Patient Details  Name: Tracy [name] MRN: [id number] Date of Birth: 09/29/1926   Medicare Observation Status Notification Given:  Yes    Malcolm MetroChildress, Serrina Minogue Demske, RN 10/15/2015, 9:34 AM

## 2015-10-15 NOTE — Evaluation (Signed)
Occupational Therapy Evaluation Patient Details Name: Tracy [name] MRN: [id number] DOB: 09/14/1926 Today's Date: 10/15/2015    History of Present Illness Tracy [name] is a 82 y.o. female with medical history significant of atrial fibrillation on anticoagulation, was in her usual state of health this morning when suddenly she developed onset of aphasia. She was having difficulty putting her words together and speaking coherently. She reports this episodes lasted approximately 15 mins and resolved on its own. She denied any difficulty walking or limb weakness. She denies changes in vision or difficulty swallowing. She has not had any numbness or tingling or chest pain. When her son discovered that she was having these symptoms, he brought her to the ED for further evaluation.   Clinical Impression   Pt awake, alert, oriented x3 this am, reports she is feeling much better since admission. Pt with no evidence of aphasia or word finding difficulties this am. Pt demonstrates ADL completion at baseline, requires min assist for donning socks due to limited ability to reach feet, supervision for standing tasks. Pt reports daughter assists with ADL tasks daily including dressing and bathing; pt lives with son who is available to assist as needed. Pt appears to be at baseline, no further OT services required at this time.     Follow Up Recommendations  No OT follow up;Supervision/Assistance - 24 hour    Equipment Recommendations  None recommended by OT       Precautions / Restrictions Precautions Precautions: None Restrictions Weight Bearing Restrictions: No      Mobility Bed Mobility Overal bed mobility: Modified Independent                Transfers Overall transfer level: Modified independent Equipment used: None                       ADL Overall ADL's : Needs assistance/impaired     Grooming: Wash/dry hands;Modified independent;Standing               Lower  Body Dressing: Minimal assistance;Sitting/lateral leans   Toilet Transfer: Modified Independent;Regular Toilet   Toileting- Clothing Manipulation and Hygiene: Modified independent;Sit to/from stand       Functional mobility during ADLs: Supervision/safety       Vision Vision Assessment?: No apparent visual deficits          Pertinent Vitals/Pain Pain Assessment: No/denies pain     Hand Dominance Right   Extremity/Trunk Assessment Upper Extremity Assessment Upper Extremity Assessment: Overall WFL for tasks assessed   Lower Extremity Assessment Lower Extremity Assessment: Defer to PT evaluation       Communication Communication Communication: No difficulties   Cognition Arousal/Alertness: Awake/alert Behavior During Therapy: WFL for tasks assessed/performed Overall Cognitive Status: Within Functional Limits for tasks assessed                                Home Living Family/patient expects to be discharged to:: Private residence Living Arrangements: Children (son) Available Help at Discharge: Family;Available 24 hours/day               Bathroom Shower/Tub: Chief Strategy OfficerTub/shower unit   Bathroom Toilet: Standard     Home Equipment: Environmental consultantWalker - 2 wheels;Cane - single point;Shower seat          Prior Functioning/Environment Level of Independence: Needs assistance    ADL's / Homemaking Assistance Needed: Pt reports daughter assists with dressing and bathing tasks, family assists  with household chores         End of Session Equipment Utilized During Treatment: Gait belt  Activity Tolerance: Patient tolerated treatment well Patient left: in chair;with call bell/phone within reach;with chair alarm set   Time: [id number] OT Time Calculation (min): 23 min Charges:  OT General Charges $OT Visit: 1 Procedure OT Evaluation $OT Eval Low Complexity: 1 Procedure G-Codes: OT G-codes **NOT FOR INPATIENT CLASS** Functional Assessment Tool Used: clinical  judgement Functional Limitation: Self care Self Care Current Status ([id number]): At least 20 percent but less than 40 percent impaired, limited or restricted Self Care Goal Status ([id number]): At least 20 percent but less than 40 percent impaired, limited or restricted Self Care Discharge Status [id number]([id number]): At least 20 percent but less than 40 percent impaired, limited or restricted  Ezra SitesLeslie Helix Lafontaine, OTR/L  [phone]  10/15/2015, 10:03 AM

## 2015-10-15 NOTE — Evaluation (Signed)
Physical Therapy Evaluation Patient Details Name: Forestine Naauline Sterbenz MRN: [id number] DOB: 06/07/1926 Today's Date: 10/15/2015   History of Present Illness  82 yo F acute onset of word finding difficulties. Head CT (-). PMH: large abdominal hernia & repair, SOB, CKD, Afib, HTN.  Clinical Impression  Pt received sitting up in the chair, and was agreeable to PT evaluation.  Pt seems to be HOH, and answers some questions inappropriately (ie: was not able to answer whether or not there were stairs to get in/out of the house "I don't know, it's just a good way in"). Pt ambulated 56400ft with no overt balance issues and no DME.  She did have difficulty finding her way back to her room.  Expressed to her on 3 occasions that her room number is 306, but needed cues to look at room numbers when she nearly entered room 308.  Pt does not demonstrate any physical limitations at this time, therefore, no further skilled PT recommended at this time & she states that family already provides 24/7 supervision.     Follow Up Recommendations No PT follow up    Equipment Recommendations  None recommended by PT    Recommendations for Other Services       Precautions / Restrictions Precautions Precautions: None Restrictions Weight Bearing Restrictions: No      Mobility  Bed Mobility Overal bed mobility: Modified Independent                Transfers Overall transfer level: Modified independent Equipment used: None                Ambulation/Gait Ambulation/Gait assistance: Modified independent (Device/Increase time) Ambulation Distance (Feet): 500 Feet Assistive device: None Gait Pattern/deviations: WFL(Within Functional Limits)     General Gait Details: Pt did have some difficulty finding her room.  Expressed to her on 3 occasions that her room is 306.  Needed cues to look at the room number as she nearly entered room 308.  Stairs            Wheelchair Mobility    Modified Rankin  (Stroke Patients Only)       Balance Overall balance assessment: No apparent balance deficits (not formally assessed)                                           Pertinent Vitals/Pain Pain Assessment: No/denies pain    Home Living Family/patient expects to be discharged to:: Private residence Living Arrangements: Children (son) Available Help at Discharge: Family;Available 24 hours/day Type of Home: House     Entrance Stairs-Number of Steps: Pt became confused when asking this question - she keeps stating "it's a good way in"  Home Layout: One level Home Equipment: Walker - 2 wheels;Shower seat      Prior Function Level of Independence: Needs assistance      ADL's / Homemaking Assistance Needed: Pt reports daughter assists with dressing and bathing tasks, family assists with household chores        Hand Dominance   Dominant Hand: Right    Extremity/Trunk Assessment   Upper Extremity Assessment: Overall WFL for tasks assessed           Lower Extremity Assessment: Overall WFL for tasks assessed         Communication   Communication: HOH (Pt seems to be HOH.  Needs questions repeated multiple times.)  Cognition  Arousal/Alertness: Awake/alert Behavior During Therapy: WFL for tasks assessed/performed Overall Cognitive Status:  (States she has trouble sometimes with word finding.  "I know what I'm thinking about, but sometimes I can't think of the word"   However, she states that she is not having any further issues with this. )       Memory: Decreased short-term memory (had difficulty remembering what her room number was. )              General Comments      Exercises        Assessment/Plan    PT Assessment Patent does not need any further PT services  PT Diagnosis Generalized weakness   PT Problem List    PT Treatment Interventions     PT Goals (Current goals can be found in the Care Plan section) Acute Rehab PT Goals PT  Goal Formulation: All assessment and education complete, DC therapy    Frequency     Barriers to discharge        Co-evaluation               End of Session Equipment Utilized During Treatment: Gait belt Activity Tolerance: Patient tolerated treatment well Patient left: in chair;with chair alarm set Nurse Communication: Mobility status    Functional Assessment Tool Used: [employer] AM-PAC "6-clicks"  Functional Limitation: Mobility: Walking and moving around Mobility: Walking and Moving Around Current Status [phone](G8978): 0 percent impaired, limited or restricted Mobility: Walking and Moving Around Goal Status [phone](G8979): 0 percent impaired, limited or restricted Mobility: Walking and Moving Around Discharge Status [phone](G8980): 0 percent impaired, limited or restricted    Time: 1002-1025 PT Time Calculation (min) (ACUTE ONLY): 23 min   Charges:   PT Evaluation $PT Eval Low Complexity: 1 Procedure PT Treatments $Therapeutic Activity: 8-22 mins   PT G Codes:   PT G-Codes **NOT FOR INPATIENT CLASS** Functional Assessment Tool Used: [employer] AM-PAC "6-clicks"  Functional Limitation: Mobility: Walking and moving around Mobility: Walking and Moving Around Current Status [phone](G8978): 0 percent impaired, limited or restricted Mobility: Walking and Moving Around Goal Status [phone](G8979): 0 percent impaired, limited or restricted Mobility: Walking and Moving Around Discharge Status [phone](G8980): 0 percent impaired, limited or restricted    [employer] Partnersebecca Hannan Tetzlaff 10/15/2015, 10:33 AM

## 2015-10-15 NOTE — Progress Notes (Signed)
ANTICOAGULATION CONSULT NOTE - follow up  Pharmacy Consult for Pharmacy Indication: atrial fibrillation  Allergies  Allergen Reactions  . Penicillins Shortness Of Breath, Swelling and Rash    Has patient had a PCN reaction causing immediate rash, facial/tongue/throat swelling, SOB or lightheadedness with hypotension: Yesyes Has patient had a PCN reaction causing severe rash involving mucus membranes or skin necrosis: Nono Has patient had a PCN reaction that required hospitalization Nono Has patient had a PCN reaction occurring within the last 10 years: No If all of the above answers are "NO", then may proceed with Cephalosporin u  . Tylenol With Codeine #3 [Acetaminophen-Codeine] Other (See Comments)    Nervousness  "jitters"    Patient Measurements: Height: 5\' 2"  (157.5 cm) Weight: 156 lb 11.2 oz (71.079 kg) IBW/kg (Calculated) : 50.1  Vital Signs: Temp: 97.7 F (36.5 C) (06/30 0645) Temp Source: Oral (06/30 0645) BP: 123/58 mmHg (06/30 1035) Pulse Rate: 53 (06/30 1035)  Labs:  Recent Labs  10/14/15 1256 10/14/15 1351 10/15/15 0605  HGB 15.6* 17.0*  --   HCT 47.4* 50.0*  --   PLT 292  --   --   APTT 36  --   --   LABPROT 23.0*  --  25.4*  INR 2.05*  --  2.34*  CREATININE 1.17* 1.20*  --     Estimated Creatinine Clearance: 31.1 mL/min (by C-G formula based on Cr of 1.2).   Medical History: Past Medical History  Diagnosis Date  . Shortness of breath   . Chronic kidney disease     hx kidney infection  . A-fib (HCC)   . Hypertension   . Hernia, abdominal     Medications:  Prescriptions prior to admission  Medication Sig Dispense Refill Last Dose  . bimatoprost (LUMIGAN) 0.01 % SOLN Place 1 drop into both eyes at bedtime.   10/13/2015 at Unknown time  . furosemide (LASIX) 20 MG tablet TAKE 1 TAB FOR 3 DAYS 3 tablet 0 10/14/2015 at Unknown time  . metoprolol tartrate (LOPRESSOR) 25 MG tablet Take 12.5 mg by mouth 2 (two) times daily.   10/14/2015 at 0830  .  warfarin (COUMADIN) 5 MG tablet Take 2.5 mg by mouth daily.    10/13/2015 at 1700    Assessment: Okay for Protocol, INR at goal.  Last dose 6/28 per med list. INR therapeutic at 2.34.   Goal of Therapy:  INR 2-3   Plan:  Warfarin 2.5mg  PO today Daily PT/INR Monitor for signs and symptoms of bleeding.   Elder CyphersLorie Rumi Kolodziej, BS Loura BackPharm D, BCPS Clinical Pharmacist Pager [phone] 10/15/2015,10:50 AM

## 2015-10-15 NOTE — Discharge Summary (Signed)
Physician Discharge Summary  Tracy [name] BJY:[id number] DOB: Jul 20, 1926 DOA: 10/14/2015  PCP: No PCP Per Patient  Admit date: 10/14/2015 Discharge date: 10/15/2015  Admitted From: home Disposition:  home  Recommendations for Outpatient Follow-up:  1. Follow up with PCP in 1-2 weeks 2. Follow up with Dr. Purvis Sheffield to discuss changing coumadin to eliquis/xarelto  Home Health:No Equipment/Devices:  Discharge Condition: stable CODE STATUS: full Diet recommendation:heart healthy  Brief/Interim Summary: This patient was admitted to the hospital with transient aphasia. Symptoms lasted approximately 15 mins and then resolved on their own. She was admitted to the hospital for TIA work up. Patient refused to have MRI/MRA done. Carotid dopplers and echo were unremarkable. LDL noted to be 113 and she was started on lipitor. She is already anticoagulated with coumadin for Atrial fibrillation. She was seen by neurology who did not feel that adding aspirin would be of benefit. It was recommended to consider changing coumadin to new anticoagulants, but patient refused to consider this until she discusses with her cardiologist, Dr. Purvis Sheffield. She is otherwise stable for discharge.  She was noted to have LE edema. Venous dopplers negative for DVT. She will be continued on her outpatient dose of lasix.  Discharge Diagnoses:  Active Problems:   Atrial fibrillation (HCC) [I48.91]   TIA (transient ischemic attack)   Chronic anticoagulation   Bilateral lower extremity edema   HTN (hypertension)    Discharge Instructions      Discharge Instructions    Diet - low sodium heart healthy    Complete by:  As directed      Increase activity slowly    Complete by:  As directed             Medication List    TAKE these medications        atorvastatin 10 MG tablet  Commonly known as:  LIPITOR  Take 1 tablet (10 mg total) by mouth daily at 6 PM.     bimatoprost 0.01 % Soln  Commonly known as:   LUMIGAN  Place 1 drop into both eyes at bedtime.     furosemide 20 MG tablet  Commonly known as:  LASIX  TAKE 1 TAB FOR 3 DAYS     metoprolol tartrate 25 MG tablet  Commonly known as:  LOPRESSOR  Take 12.5 mg by mouth 2 (two) times daily.     warfarin 5 MG tablet  Commonly known as:  COUMADIN  Take 2.5 mg by mouth daily.        Allergies  Allergen Reactions  . Penicillins Shortness Of Breath, Swelling and Rash    Consultations:  Neurology   Procedures/Studies: US Carotid Bilateral  10/15/2015  CLINICAL DATA:  TIA, aphasia for 1 day.  History of hypertension. EXAM: BILATERAL CAROTID DUPLEX ULTRASOUND TECHNIQUE: Wallace Cullens scale imaging, color Doppler and duplex ultrasound were performed of bilateral carotid and vertebral arteries in the neck. COMPARISON:  Head CT -10/14/2015 FINDINGS: Criteria: Quantification of carotid stenosis is based on velocity parameters that correlate the residual internal carotid diameter with NASCET-based stenosis levels, using the diameter of the distal internal carotid lumen as the denominator for stenosis measurement. The following velocity measurements were obtained: RIGHT ICA:  81/16 cm/sec CCA:  95/8 cm/sec SYSTOLIC ICA/CCA RATIO:  0.9 DIASTOLIC ICA/CCA RATIO:  1.9 ECA:  89 cm/sec LEFT ICA:  122/17 cm/sec CCA:  76/6 cm/sec SYSTOLIC ICA/CCA RATIO:  1.7 DIASTOLIC ICA/CCA RATIO:  1.5 ECA:  56 cm/sec RIGHT CAROTID ARTERY: There is a minimal amount of eccentric  mixed echogenic plaque within the right carotid bulb (image 11, not resulting in elevated peak systolic velocities within the interrogated course the right internal carotid artery to suggest a hemodynamically significant stenosis. RIGHT VERTEBRAL ARTERY:  Antegrade Flow LEFT CAROTID ARTERY: There is a moderate amount of eccentric mixed echogenic plaque within in the left carotid bulb (image 51), extending to involve the origin and proximal aspect the left internal carotid artery (image 59), not resulting in  elevated peak systolic velocities within the interrogated course of the left internal carotid artery to suggest a hemodynamically significant stenosis. LEFT VERTEBRAL ARTERY:  Antegrade flow Incidental note is made of a cardiac arrhythmia (representative images 48 and 53). IMPRESSION: 1. Minimal to moderate amount of bilateral atherosclerotic plaque, left greater than right, not resulting in a hemodynamically significant stenosis within either internal carotid artery. 2. Incidental note is made of a cardiac arrhythmia. Further evaluation with ECG monitoring could be performed as clinically indicated. Electronically Signed   By: Simonne ComeJohn  Watts M.D.   On: 10/15/2015 15:21   [place] Venous Img Lower Bilateral  10/15/2015  CLINICAL DATA:  Bilateral lower extremity edema. Shortness of breath. EXAM: BILATERAL LOWER EXTREMITY VENOUS DOPPLER ULTRASOUND TECHNIQUE: Gray-scale sonography with graded compression, as well as color Doppler and duplex ultrasound were performed to evaluate the lower extremity deep venous systems from the level of the common femoral vein and including the common femoral, femoral, profunda femoral, popliteal and calf veins including the posterior tibial, peroneal and gastrocnemius veins when visible. The superficial great saphenous vein was also interrogated. Spectral Doppler was utilized to evaluate flow at rest and with distal augmentation maneuvers in the common femoral, femoral and popliteal veins. COMPARISON:  No recent prior. FINDINGS: RIGHT LOWER EXTREMITY Common Femoral Vein: No evidence of thrombus. Normal compressibility, respiratory phasicity and response to augmentation. Saphenofemoral Junction: No evidence of thrombus. Normal compressibility and flow on color Doppler imaging. Profunda Femoral Vein: No evidence of thrombus. Normal compressibility and flow on color Doppler imaging. Femoral Vein: No evidence of thrombus. Normal compressibility, respiratory phasicity and response to augmentation.  Popliteal Vein: No evidence of thrombus. Normal compressibility, respiratory phasicity and response to augmentation. Calf Veins: No evidence of thrombus. Normal compressibility and flow on color Doppler imaging. Superficial Great Saphenous Vein: No evidence of thrombus. Normal compressibility and flow on color Doppler imaging. Venous Reflux:  None. Other Findings: 3.5 x 1.6 x 4.2 cm cyst in the right popliteal fossa consistent with Baker cyst. LEFT LOWER EXTREMITY Common Femoral Vein: No evidence of thrombus. Normal compressibility, respiratory phasicity and response to augmentation. Saphenofemoral Junction: No evidence of thrombus. Normal compressibility and flow on color Doppler imaging. Profunda Femoral Vein: No evidence of thrombus. Normal compressibility and flow on color Doppler imaging. Femoral Vein: No evidence of thrombus. Normal compressibility, respiratory phasicity and response to augmentation. Popliteal Vein: No evidence of thrombus. Normal compressibility, respiratory phasicity and response to augmentation. Calf Veins: No evidence of thrombus. Normal compressibility and flow on color Doppler imaging. Superficial Great Saphenous Vein: No evidence of thrombus. Normal compressibility and flow on color Doppler imaging. Venous Reflux:  None. Other Findings:  None. IMPRESSION: 1. No evidence of deep venous thrombosis. 2.  3.5 x 1.6 x 4.2 cm right Baker cyst. Electronically Signed   By: Maisie Fushomas  Register   On: 10/15/2015 15:34   Ct Head Code Stroke W/o Cm  10/14/2015  CLINICAL DATA:  Aphasia and confusion starting at 11 a.m. EXAM: CT HEAD WITHOUT CONTRAST TECHNIQUE: Contiguous axial images were obtained from  the base of the skull through the vertex without intravenous contrast. COMPARISON:  None. FINDINGS: Brain: No evidence of acute infarction, hemorrhage, hydrocephalus, or mass lesion/mass effect. Normal appearance of the brain for age, including mild generalized cerebral volume loss and periventricular  white matter gliosis.ASPECTS is 10. Vascular: No hyperdense vessel or unexpected calcification. Skull: Negative for fracture or focal lesion. Sinuses/Orbits: Symmetric enophthalmos presumably from paucity of fat. Cataract resection on the right. Other: These results were called by telephone at the time of interpretation on 10/14/2015 at 1:56 pm to Dr. Linwood Dibbles , who verbally acknowledged these results. IMPRESSION: No acute finding.  Age congruent senescent changes. Electronically Signed   By: Marnee Spring M.D.   On: 10/14/2015 13:58   Echo: - Left ventricle: The cavity size was normal. Wall thickness was  normal. Systolic function was vigorous. The estimated ejection  fraction was in the range of 65% to 70%.  Carotid doppler: 1. Minimal to moderate amount of bilateral atherosclerotic plaque, left greater than right, not resulting in a hemodynamically significant stenosis within either internal carotid artery.   Subjective: No new complaints. She is refusing to undergo MRI today.   Discharge Exam: Filed Vitals:   10/15/15 1045 10/15/15 1628  BP: 127/58 144/66  Pulse: 55 52  Temp: 98 F (36.7 C) 97.5 F (36.4 C)  Resp:  20   Filed Vitals:   10/15/15 1035 10/15/15 1045 10/15/15 1359 10/15/15 1628  BP: 123/58 127/58  144/66  Pulse: 53 55  52  Temp:  98 F (36.7 C)  97.5 F (36.4 C)  TempSrc:    Oral  Resp:    20  Height:      Weight:      SpO2:  98% 98% 98%    General: Pt is alert, awake, not in acute distress Cardiovascular: RRR, S1/S2 +, no rubs, no gallops Respiratory: CTA bilaterally, no wheezing, no rhonchi Abdominal: Soft, NT, ND, bowel sounds + Extremities: no edema, no cyanosis    The results of significant diagnostics from this hospitalization (including imaging, microbiology, ancillary and laboratory) are listed below for reference.     Microbiology: No results found for this or any previous visit (from the past 240 hour(s)).   Labs: BNP (last 3  results)  Recent Labs  10/14/15 1256  BNP 159.0*   Basic Metabolic Panel:  Recent Labs Lab 10/14/15 1256 10/14/15 1351  NA 136 140  K 3.6 3.8  CL 100* 97*  CO2 28  --   GLUCOSE 117* 105*  BUN 22* 23*  CREATININE 1.17* 1.20*  CALCIUM 8.9  --    Liver Function Tests:  Recent Labs Lab 10/14/15 1256  AST 26  ALT 20  ALKPHOS 93  BILITOT 0.8  PROT 6.9  ALBUMIN 3.7   No results for input(s): LIPASE, AMYLASE in the last 168 hours. No results for input(s): AMMONIA in the last 168 hours. CBC:  Recent Labs Lab 10/14/15 1256 10/14/15 1351  WBC 7.6  --   NEUTROABS 5.1  --   HGB 15.6* 17.0*  HCT 47.4* 50.0*  MCV 89.8  --   PLT 292  --    Cardiac Enzymes: No results for input(s): CKTOTAL, CKMB, CKMBINDEX, TROPONINI in the last 168 hours. BNP: Invalid input(s): POCBNP CBG:  Recent Labs Lab 10/14/15 1308  GLUCAP 140*   D-Dimer No results for input(s): DDIMER in the last 72 hours. Hgb A1c No results for input(s): HGBA1C in the last 72 hours. Lipid Profile  Recent  Labs  10/15/15 0605  CHOL 193  HDL 60  LDLCALC 113*  TRIG 99  CHOLHDL 3.2   Thyroid function studies No results for input(s): TSH, T4TOTAL, T3FREE, THYROIDAB in the last 72 hours.  Invalid input(s): FREET3 Anemia work up No results for input(s): VITAMINB12, FOLATE, FERRITIN, TIBC, IRON, RETICCTPCT in the last 72 hours. Urinalysis    Component Value Date/Time   COLORURINE YELLOW 10/14/2015 1345   APPEARANCEUR CLEAR 10/14/2015 1345   LABSPEC 1.010 10/14/2015 1345   PHURINE 6.0 10/14/2015 1345   GLUCOSEU NEGATIVE 10/14/2015 1345   HGBUR NEGATIVE 10/14/2015 1345   BILIRUBINUR NEGATIVE 10/14/2015 1345   KETONESUR NEGATIVE 10/14/2015 1345   PROTEINUR NEGATIVE 10/14/2015 1345   UROBILINOGEN 1.0 03/25/2013 1726   NITRITE NEGATIVE 10/14/2015 1345   LEUKOCYTESUR SMALL* 10/14/2015 1345   Sepsis Labs Invalid input(s): PROCALCITONIN,  WBC,  LACTICIDVEN Microbiology No results found for  this or any previous visit (from the past 240 hour(s)).   Time coordinating discharge: Over 30 minutes  SIGNED:   Erick BlinksMEMON,Ezel Vallone, MD  [facility] 10/15/2015, 5:49 PM Pager [phone]  If 7PM-7AM, please contact night-coverage [email or web address] Password [email or web address]

## 2015-10-15 NOTE — Progress Notes (Signed)
Patient discharged home.  IV removed - WNL.  Reviewed DC instructions and medications with patient and son, verbalized understanding.  Educated on stroke.  No questions at this time.  Instructed to follow up with MD.  Left unit via WC in NAD

## 2015-10-15 NOTE — Care Management Note (Signed)
Case Management Note  Patient Details  Name: Forestine Naauline Rigdon MRN: [id number] Date of Birth: 05/06/1926  Subjective/Objective:                  Pt admitted with TIA, reports no symptoms at resent. Pt is from home, lives with her son and is ind with ADL's. Pt has cane and walker but does not use them. Pt's son provides transportation to appointments. Pt plans to return home with self care at DC.   Action/Plan: No CM needs anticipated.   Expected Discharge Date:  10/16/15               Expected Discharge Plan:  Home/Self Care  In-House Referral:  NA  Discharge planning Services  CM Consult  Post Acute Care Choice:  NA Choice offered to:  NA  DME Arranged:    DME Agency:     HH Arranged:    HH Agency:     Status of Service:  Completed, signed off  If discussed at [employer] Length of Stay Meetings, dates discussed:    Additional Comments:  Malcolm MetroChildress, Sonam Huelsmann Demske, RN 10/15/2015, 9:39 AM

## 2015-10-16 LAB — HEMOGLOBIN A1C
HEMOGLOBIN A1C: 5.5 % (ref 4.8–5.6)
Mean Plasma Glucose: 111 mg/dL

## 2015-10-25 ENCOUNTER — Telehealth: Payer: Self-pay | Admitting: *Deleted

## 2015-10-25 NOTE — Telephone Encounter (Signed)
Returned call to pt's son. Advised him that pts INR is therapeutic and some bruising is expected with Coumadin therapy. Will not make any Coumadin adjustments especially given recent ED visit for TIA workup. He verbalized understanding.

## 2015-10-25 NOTE — Telephone Encounter (Signed)
Pt son says pt developed bruises on wrist and ankles yesterday - doesn't remember bumping or hitting anything. Denies any bleeding, missed doses of coumadin, or recent med changes. [facility]. Will forward to [address] coumadin with Misty StanleyLisa out of office.

## 2015-11-02 ENCOUNTER — Ambulatory Visit (INDEPENDENT_AMBULATORY_CARE_PROVIDER_SITE_OTHER): Payer: Medicare PPO | Admitting: *Deleted

## 2015-11-02 DIAGNOSIS — I4891 Unspecified atrial fibrillation: Secondary | ICD-10-CM

## 2015-11-02 DIAGNOSIS — Z5181 Encounter for therapeutic drug level monitoring: Secondary | ICD-10-CM | POA: Diagnosis not present

## 2015-11-02 LAB — POCT INR: INR: 3

## 2015-11-05 ENCOUNTER — Ambulatory Visit (INDEPENDENT_AMBULATORY_CARE_PROVIDER_SITE_OTHER): Payer: Medicare PPO | Admitting: Cardiovascular Disease

## 2015-11-05 VITALS — BP 148/67 | HR 49 | Ht 62.0 in | Wt 153.0 lb

## 2015-11-05 DIAGNOSIS — Z87898 Personal history of other specified conditions: Secondary | ICD-10-CM

## 2015-11-05 DIAGNOSIS — E785 Hyperlipidemia, unspecified: Secondary | ICD-10-CM | POA: Diagnosis not present

## 2015-11-05 DIAGNOSIS — Z5181 Encounter for therapeutic drug level monitoring: Secondary | ICD-10-CM

## 2015-11-05 DIAGNOSIS — I482 Chronic atrial fibrillation: Secondary | ICD-10-CM

## 2015-11-05 DIAGNOSIS — I4821 Permanent atrial fibrillation: Secondary | ICD-10-CM

## 2015-11-05 DIAGNOSIS — M7989 Other specified soft tissue disorders: Secondary | ICD-10-CM

## 2015-11-05 DIAGNOSIS — Z7901 Long term (current) use of anticoagulants: Secondary | ICD-10-CM

## 2015-11-05 DIAGNOSIS — Z9289 Personal history of other medical treatment: Secondary | ICD-10-CM

## 2015-11-05 MED ORDER — APIXABAN 5 MG PO TABS
5.0000 mg | ORAL_TABLET | Freq: Two times a day (BID) | ORAL | Status: DC
Start: 2015-11-05 — End: 2015-11-05

## 2015-11-05 MED ORDER — APIXABAN 5 MG PO TABS: 5.0000 mg | ORAL_TABLET | Freq: Two times a day (BID) | ORAL | Status: DC

## 2015-11-05 MED ORDER — FUROSEMIDE 20 MG PO TABS: 20.0000 mg | ORAL_TABLET | Freq: Every day | ORAL | Status: DC | PRN

## 2015-11-05 NOTE — Addendum Note (Signed)
Addended by: Lesle ChrisHILL, ANGELA G on: 11/05/2015 04:03 PM   Modules accepted: Orders

## 2015-11-05 NOTE — Patient Instructions (Signed)
Medication Instructions:   Stop Coumadin (Warfarin).  Begin Eliquis 5mg  twice a day  - samples, printed scripts, & free 30-day trial card given today - MUST BE OFF WARFARIN 48 HOURS PRIOR TO BEGINNING THIS MEDICATION.   Begin Lasix 20mg  daily as needed for edema / swellling - [facility]  Continue all other medications.    Labwork: NONE  Testing/Procedures: NONE  Follow-Up:  1 month with Misty StanleyLisa (anticoagulation nurse) for new Eliquis management.    Your physician wants you to follow up in:  1 year.  You will receive a reminder letter in the mail one-two months in advance.  If you don't receive a letter, please call our office to schedule the follow up appointment - Dr. Purvis SheffieldKoneswaran.   Any Other Special Instructions Will Be Listed Below (If Applicable).  If you need a refill on your cardiac medications before your next appointment, please call your pharmacy.

## 2015-11-05 NOTE — Progress Notes (Signed)
Patient ID: Tracy [name], female   DOB: 04/03/1927, 82 y.o.   MRN: [id number]      SUBJECTIVE: The patient is an 82 year old woman with a history of atrial fibrillation and is on low-dose metoprolol and is anticoagulated with warfarin. She was hospitalized in June for transient aphasia. She refused to have an MRI done. Carotid Dopplers and echocardiogram were unremarkable (Echo reviewed, EF 65-70%). LDL was noted to be 113 and she was started on Lipitor. There was some discussion of switching from warfarin to a newer anticoagulant but she wanted to discuss this with me first.  Continues to have right leg and ankle swelling. The Lasix I prescribed previously did alleviate some of this. There was no evidence of DVT.   Review of Systems: As per "subjective", otherwise negative.  Allergies  Allergen Reactions  . Penicillins Shortness Of Breath, Swelling and Rash    Current Outpatient Prescriptions  Medication Sig Dispense Refill  . bimatoprost (LUMIGAN) 0.01 % SOLN Place 1 drop into both eyes at bedtime.    . metoprolol tartrate (LOPRESSOR) 25 MG tablet Take 12.5 mg by mouth 2 (two) times daily.    Marland Kitchen. warfarin (COUMADIN) 5 MG tablet Take 2.5 mg by mouth daily.     Marland Kitchen. atorvastatin (LIPITOR) 10 MG tablet Take 1 tablet (10 mg total) by mouth daily at 6 PM. (Patient not taking: Reported on 11/05/2015) 30 tablet 1   No current facility-administered medications for this visit.    Past Medical History  Diagnosis Date  . Shortness of breath   . Chronic kidney disease     hx kidney infection  . A-fib (HCC)   . Hypertension   . Hernia, abdominal     Past Surgical History  Procedure Laterality Date  . Hernia repair      umbilical  . Appendectomy    . Cataract extraction w/phaco Right 08/28/2012    Procedure: CATARACT EXTRACTION PHACO AND INTRAOCULAR LENS PLACEMENT (IOC);  Surgeon: Chalmers Guestoy Whitaker, MD;  [facility];  Service: Ophthalmology;  Laterality: Right;  . Tubal ligation       Social History   Social History  . Marital Status: Widowed    Spouse Name: N/A  . Number of Children: N/A  . Years of Education: N/A   Occupational History  . Not on file.   Social History Main Topics  . Smoking status: Never Smoker   . Smokeless tobacco: Never Used  . Alcohol Use: No  . Drug Use: No  . Sexual Activity: No   Other Topics Concern  . Not on file   Social History Narrative     Filed Vitals:   11/05/15 1509  BP: 148/67  Pulse: 49  Height: 5\' 2"  (1.575 m)  Weight: 153 lb (69.4 kg)    PHYSICAL EXAM General: NAD Neck: No JVD, no thyromegaly or thyroid nodule.  Lungs: Clear to auscultation bilaterally with normal respiratory effort. CV: Nondisplaced PMI. Regular rate and irregular rhythm, normal S1/S2, no S3, no murmur. 1+ pitting right pretibial and periankle edema. Abdomen: Soft.  Skin: Intact without lesions or rashes.  Neurologic: Alert and oriented x 3.  Psych: Normal affect. Extremities: No clubbing or cyanosis.  HEENT: Hard of hearing.    ECG: Most recent ECG reviewed.      ASSESSMENT AND PLAN: 1. Atrial fibrillation: HR controlled on metoprolol. Anticoagulated with warfarin. Will switch to Eliquis 5 mg bid.  2. Hyperlipidemia: Continue Lipitor.  3. Right leg edema: Will prescribe Lasix 20 mg prn.  Dispo: fu 1 year.  Prentice Docker, M.D., F.A.C.C.

## 2015-12-07 ENCOUNTER — Ambulatory Visit (INDEPENDENT_AMBULATORY_CARE_PROVIDER_SITE_OTHER): Payer: Medicare PPO | Admitting: *Deleted

## 2015-12-07 DIAGNOSIS — I4891 Unspecified atrial fibrillation: Secondary | ICD-10-CM | POA: Diagnosis not present

## 2015-12-07 DIAGNOSIS — Z5181 Encounter for therapeutic drug level monitoring: Secondary | ICD-10-CM | POA: Diagnosis not present

## 2015-12-07 LAB — BASIC METABOLIC PANEL
BUN: 22 mg/dL (ref 7–25)
CO2: 26 mmol/L (ref 20–31)
Calcium: 9 mg/dL (ref 8.6–10.4)
Chloride: 104 mmol/L (ref 98–110)
Creat: 1.08 mg/dL — ABNORMAL HIGH (ref 0.60–0.88)
GLUCOSE: 80 mg/dL (ref 65–99)
POTASSIUM: 4 mmol/L (ref 3.5–5.3)
SODIUM: 139 mmol/L (ref 135–146)

## 2015-12-07 LAB — CBC
HEMATOCRIT: 43.4 % (ref 35.0–45.0)
HEMOGLOBIN: 14.4 g/dL (ref 11.7–15.5)
MCH: 29.7 pg (ref 27.0–33.0)
MCHC: 33.2 g/dL (ref 32.0–36.0)
MCV: 89.5 fL (ref 80.0–100.0)
MPV: 11 fL (ref 7.5–12.5)
Platelets: 282 10*3/uL (ref 140–400)
RBC: 4.85 MIL/uL (ref 3.80–5.10)
RDW: 15.3 % — ABNORMAL HIGH (ref 11.0–15.0)
WBC: 5.8 10*3/uL (ref 3.8–10.8)

## 2015-12-07 MED ORDER — APIXABAN 5 MG PO TABS: 5.0000 mg | ORAL_TABLET | Freq: Two times a day (BID) | ORAL | 3 refills | Status: DC

## 2015-12-07 NOTE — Progress Notes (Addendum)
Pt was started on Eliquis 5mg  bid for atrial fibrillation on 11/05/15 by Dr. Purvis Sheffieldkoneswaran.    Reviewed patients medication list.  Pt is not currently on any combined P-gp and strong CYP3A4 inhibitors/inducers (ketoconazole, traconazole, ritonavir, carbamazepine, phenytoin, rifampin, St. John's wort).  Reviewed labs from 12/07/15 @ [facility].  SCr 1.08, Weight 69kg, CrCl 40.73.  Dose is appropriate based on age,weight,SrCr.   Hgb and HCT: 14.4/43.4  A full discussion of the nature of anticoagulants has been carried out.  A benefit/risk analysis has been presented to the patient, so that they understand the justification for choosing anticoagulation with Eliquis at this time.  The need for compliance is stressed.  Pt is aware to take the medication twice daily.  Side effects of potential bleeding are discussed, including unusual colored urine or stools, coughing up blood or coffee ground emesis, nose bleeds or serious fall or head trauma.  Discussed signs and symptoms of stroke. The patient should avoid any OTC items containing aspirin or ibuprofen.  Avoid alcohol consumption.   Call if any signs of abnormal bleeding.  Discussed financial obligations and resolved any difficulty in obtaining medication.  Next lab test test in 3 months.   Called pt with result/appt placed in recall

## 2015-12-15 ENCOUNTER — Telehealth: Payer: Self-pay | Admitting: *Deleted

## 2015-12-15 NOTE — Telephone Encounter (Signed)
Called patient with test results. No answer. Left message to call back.  

## 2015-12-15 NOTE — Telephone Encounter (Signed)
-----   Message from Jodelle GrossKathryn M Lawrence, NP sent at 12/10/2015  7:11 AM EDT ----- Labs reviewed. Look fine. No changes in medical regimen

## 2016-01-27 ENCOUNTER — Ambulatory Visit: Payer: Self-pay | Admitting: *Deleted

## 2016-01-27 DIAGNOSIS — I4891 Unspecified atrial fibrillation: Secondary | ICD-10-CM

## 2016-01-27 DIAGNOSIS — Z5181 Encounter for therapeutic drug level monitoring: Secondary | ICD-10-CM

## 2016-02-22 ENCOUNTER — Telehealth: Payer: Self-pay | Admitting: Cardiovascular Disease

## 2016-02-22 NOTE — Telephone Encounter (Signed)
Patient walked in office  Feet continuing to swell would like to know what she needs to do

## 2016-02-22 NOTE — Telephone Encounter (Signed)
Feet, ankles, legs gotten worse since last OV in July.  Not sure about weight gain.  No c/o chest pain, SOB, or dizziness.  Taking the Lasix 20mg  mostly every day.  Does not think it is helping much.  Will send to provider for further advice.

## 2016-02-23 NOTE — Telephone Encounter (Signed)
Could try compression stockings and keep legs elevated during day. Ask patient to weigh everyday. Also make f/u appt for patient

## 2016-02-23 NOTE — Telephone Encounter (Addendum)
Family member Science writer([place]) notified.  Will have her call back tomorrow to schedule follow up appointment.

## 2016-03-14 ENCOUNTER — Ambulatory Visit (INDEPENDENT_AMBULATORY_CARE_PROVIDER_SITE_OTHER): Payer: Medicare PPO | Admitting: *Deleted

## 2016-03-14 DIAGNOSIS — I4891 Unspecified atrial fibrillation: Secondary | ICD-10-CM | POA: Diagnosis not present

## 2016-03-14 LAB — CBC
HCT: 44.3 % (ref 35.0–45.0)
Hemoglobin: 14.5 g/dL (ref 11.7–15.5)
MCH: 29.2 pg (ref 27.0–33.0)
MCHC: 32.7 g/dL (ref 32.0–36.0)
MCV: 89.1 fL (ref 80.0–100.0)
MPV: 12.2 fL (ref 7.5–12.5)
PLATELETS: 274 10*3/uL (ref 140–400)
RBC: 4.97 MIL/uL (ref 3.80–5.10)
RDW: 14.5 % (ref 11.0–15.0)
WBC: 6.2 10*3/uL (ref 3.8–10.8)

## 2016-03-14 NOTE — Progress Notes (Signed)
Pt was started on Eliquis 5mg  bid for atrial fibrillation on 11/05/15 by Dr. Purvis Sheffieldkoneswaran.    Reviewed patients medication list.  Pt is not currently on any combined P-gp and strong CYP3A4 inhibitors/inducers (ketoconazole, traconazole, ritonavir, carbamazepine, phenytoin, rifampin, St. John's wort).  Reviewed labs from 03/14/16 @ [facility].  SCr 0.97, Weight 69kg, CrCl 44.51.  Dose is appropriate based on age,weight,SrCr.   Hgb and HCT: 14.5/44.3  A full discussion of the nature of anticoagulants has been carried out.  A benefit/risk analysis has been presented to the patient, so that they understand the justification for choosing anticoagulation with Eliquis at this time.  The need for compliance is stressed.  Pt is aware to take the medication twice daily.  Side effects of potential bleeding are discussed, including unusual colored urine or stools, coughing up blood or coffee ground emesis, nose bleeds or serious fall or head trauma.  Discussed signs and symptoms of stroke. The patient should avoid any OTC items containing aspirin or ibuprofen.  Avoid alcohol consumption.   Call if any signs of abnormal bleeding.  Discussed financial obligations and resolved any difficulty in obtaining medication.  Next lab test test in 3 months.   Pt made aware of results by Abelino DerrickLisa McGhee Placed in recall for 3 months

## 2016-03-15 ENCOUNTER — Telehealth: Payer: Self-pay | Admitting: *Deleted

## 2016-03-15 LAB — BASIC METABOLIC PANEL
BUN: 17 mg/dL (ref 7–25)
CHLORIDE: 104 mmol/L (ref 98–110)
CO2: 29 mmol/L (ref 20–31)
Calcium: 9 mg/dL (ref 8.6–10.4)
Creat: 0.97 mg/dL — ABNORMAL HIGH (ref 0.60–0.88)
GLUCOSE: 85 mg/dL (ref 65–99)
POTASSIUM: 4.5 mmol/L (ref 3.5–5.3)
Sodium: 140 mmol/L (ref 135–146)

## 2016-03-15 NOTE — Telephone Encounter (Signed)
-----   Message from Laqueta LindenSuresh A Koneswaran, MD sent at 03/15/2016 10:20 AM EST ----- ok

## 2016-03-15 NOTE — Telephone Encounter (Signed)
Called patient with test results. No answer. Left message to call back.  

## 2016-06-02 ENCOUNTER — Other Ambulatory Visit: Payer: Self-pay | Admitting: Cardiovascular Disease

## 2016-07-03 ENCOUNTER — Other Ambulatory Visit: Payer: Self-pay | Admitting: Cardiovascular Disease

## 2016-07-27 ENCOUNTER — Ambulatory Visit (INDEPENDENT_AMBULATORY_CARE_PROVIDER_SITE_OTHER): Payer: Medicare PPO | Admitting: *Deleted

## 2016-07-27 DIAGNOSIS — I480 Paroxysmal atrial fibrillation: Secondary | ICD-10-CM

## 2016-07-27 MED ORDER — APIXABAN 5 MG PO TABS: ORAL_TABLET | ORAL | 4 refills | Status: DC

## 2016-07-27 MED ORDER — FUROSEMIDE 20 MG PO TABS: 20.0000 mg | ORAL_TABLET | Freq: Every day | ORAL | 6 refills | Status: DC | PRN

## 2016-07-27 MED ORDER — METOPROLOL TARTRATE 25 MG PO TABS: 12.5000 mg | ORAL_TABLET | Freq: Two times a day (BID) | ORAL | 4 refills | Status: DC

## 2016-07-27 NOTE — Progress Notes (Signed)
Pt was started on Eliquis  bid for atrial fibrillationon 11/05/15 by Dr. Purvis Sheffield.   Reviewed patients medication list. Pt is notcurrently on any combined P-gp and strong CYP3A4 inhibitors/inducers (ketoconazole, traconazole, ritonavir, carbamazepine, phenytoin, rifampin, St. John's wort). Reviewed labs from 08/01/16 @ [facility]. SCr 0.99, Weight 72kg, CrCl 45.51. Dose is appropriatebased on age,weight,SrCr. Hgb and HCT: 15.2/46.8  A full discussion of the nature of anticoagulants has been carried out. A benefit/risk analysis has been presented to the patient, so that they understand the justification for choosing anticoagulation with Eliquis at this time. The need for compliance is stressed. Pt is aware to take the medication twice daily. Side effects of potential bleeding are discussed, including unusual colored urine or stools, coughing up blood or coffee ground emesis, nose bleeds or serious fall or head trauma. Discussed signs and symptoms of stroke. The patient should avoid any OTC items containing aspirin or ibuprofen. Avoid alcohol consumption. Call if any signs of abnormal bleeding. Discussed financial obligations and resolved any difficulty in obtaining medication. Next lab test test in 3months.    Pt's son was notified of lab results by Varney Biles.  Placed in recall for 3 months.

## 2016-07-31 ENCOUNTER — Other Ambulatory Visit: Payer: Self-pay | Admitting: Cardiovascular Disease

## 2016-08-01 ENCOUNTER — Other Ambulatory Visit: Payer: Self-pay | Admitting: Cardiovascular Disease

## 2016-08-01 ENCOUNTER — Other Ambulatory Visit: Payer: Self-pay

## 2016-08-01 MED ORDER — APIXABAN 5 MG PO TABS: ORAL_TABLET | ORAL | 4 refills | Status: DC

## 2016-08-02 LAB — CBC
HEMATOCRIT: 46.8 % — AB (ref 35.0–45.0)
Hemoglobin: 15.2 g/dL (ref 11.7–15.5)
MCH: 29.3 pg (ref 27.0–33.0)
MCHC: 32.5 g/dL (ref 32.0–36.0)
MCV: 90.2 fL (ref 80.0–100.0)
MPV: 11.9 fL (ref 7.5–12.5)
Platelets: 293 10*3/uL (ref 140–400)
RBC: 5.19 MIL/uL — ABNORMAL HIGH (ref 3.80–5.10)
RDW: 14.9 % (ref 11.0–15.0)
WBC: 5.8 10*3/uL (ref 3.8–10.8)

## 2016-08-02 LAB — BASIC METABOLIC PANEL
BUN: 11 mg/dL (ref 7–25)
CO2: 28 mmol/L (ref 20–31)
CREATININE: 0.99 mg/dL — AB (ref 0.60–0.88)
Calcium: 9.4 mg/dL (ref 8.6–10.4)
Chloride: 103 mmol/L (ref 98–110)
Glucose, Bld: 86 mg/dL (ref 65–99)
Potassium: 4.3 mmol/L (ref 3.5–5.3)
Sodium: 142 mmol/L (ref 135–146)

## 2016-08-03 ENCOUNTER — Telehealth: Payer: Self-pay | Admitting: *Deleted

## 2016-08-03 NOTE — Telephone Encounter (Signed)
Casimiro Needle (brother) notified of stable labs.  1 year OV scheduled for July with Dr. Purvis Sheffield.

## 2016-10-28 IMAGING — US US EXTREM LOW VENOUS BILAT
1 series · 13 of 24 positions shown · non-contrast
Comparison: No recent prior.

CLINICAL DATA: Bilateral lower extremity edema. Shortness of
breath.



[Series 1: us extrem low venous bilat · 0.07mm/px · 13 of 71 slices shown]
[im 1/71]
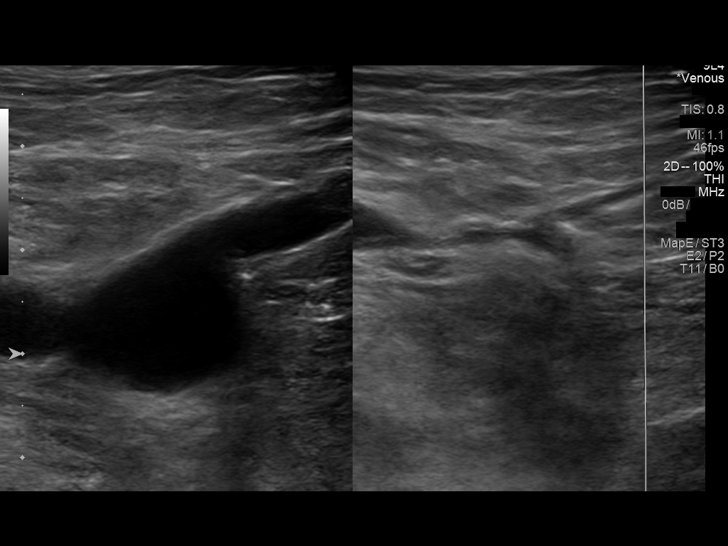
[im 7/71]
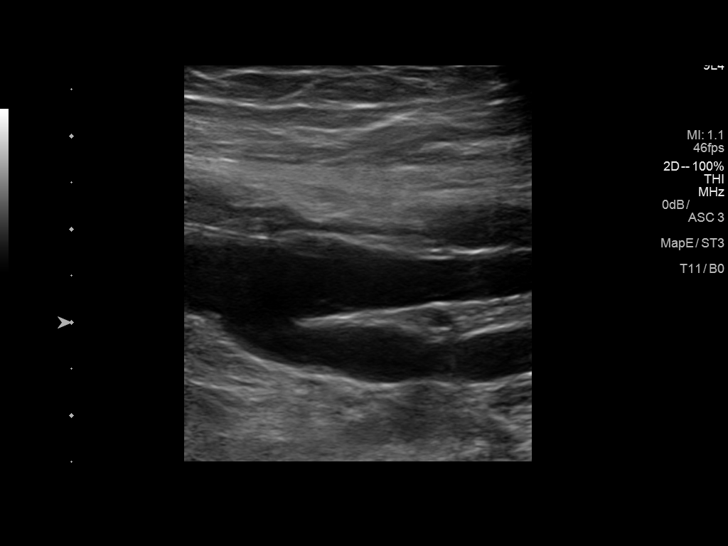
[im 13/71]
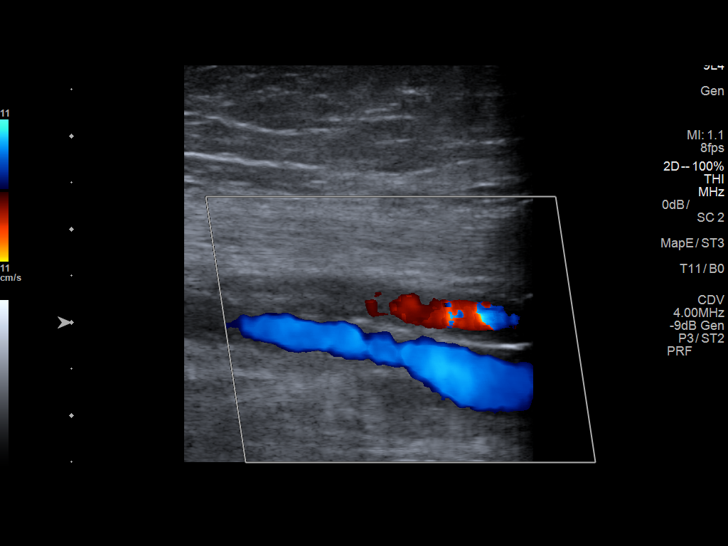
[im 19/71]
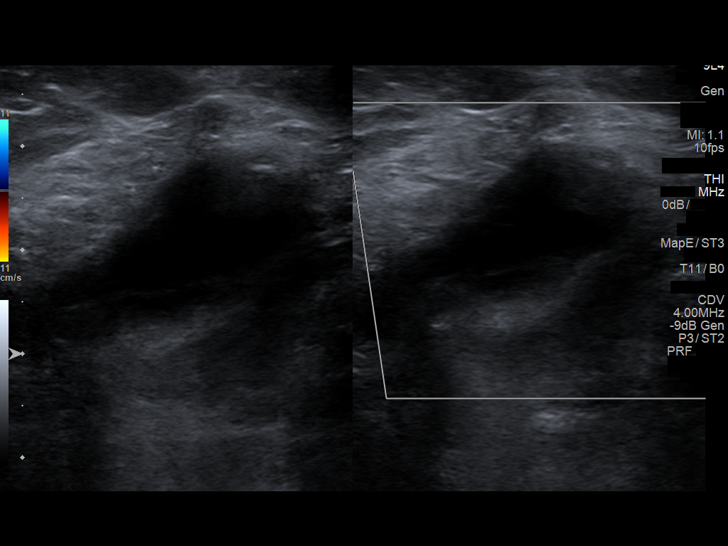
[im 25/71]
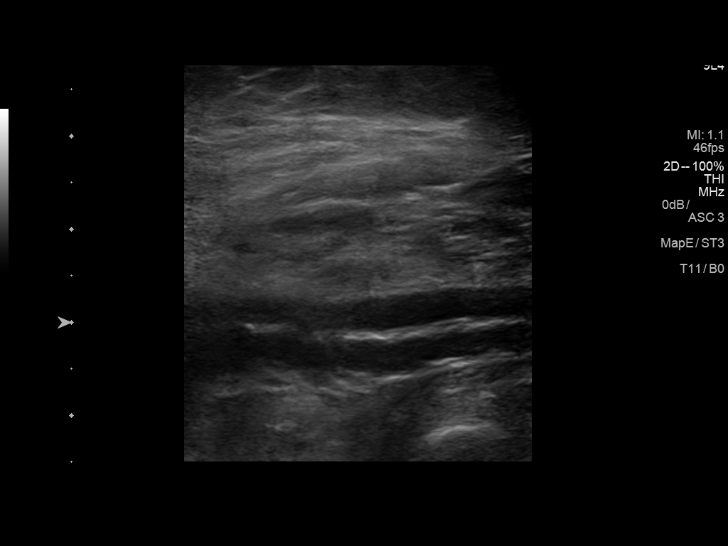
[im 31/71]
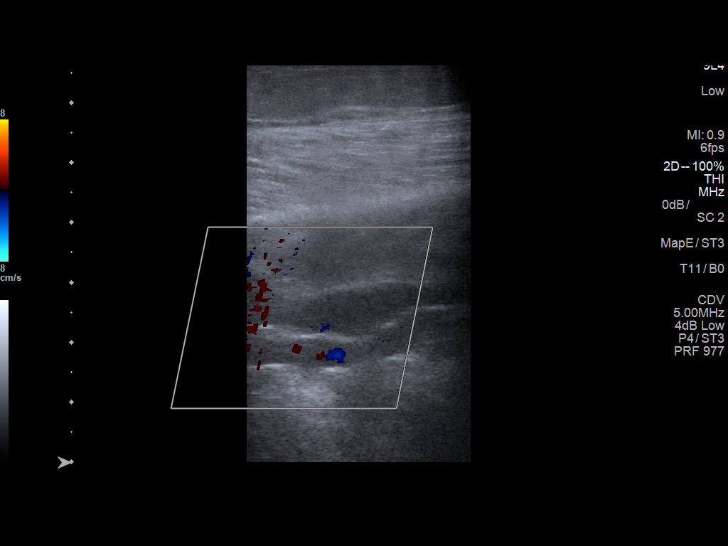
[im 37/71]
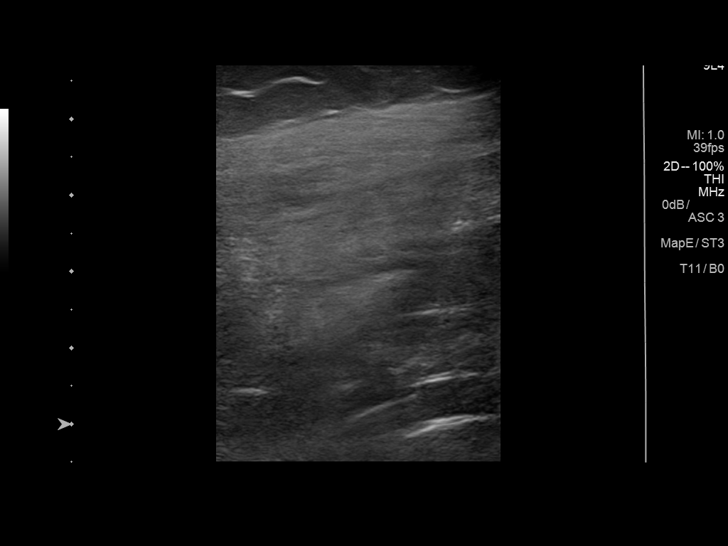
[im 40/71]
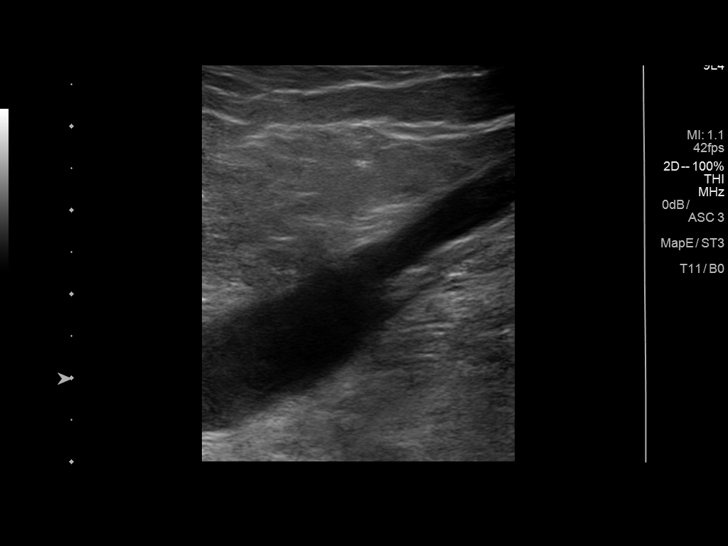
[im 46/71]
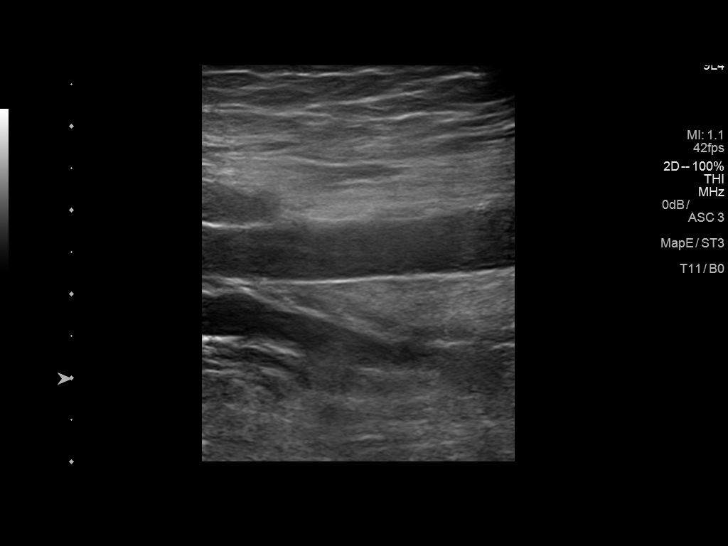
[im 52/71]
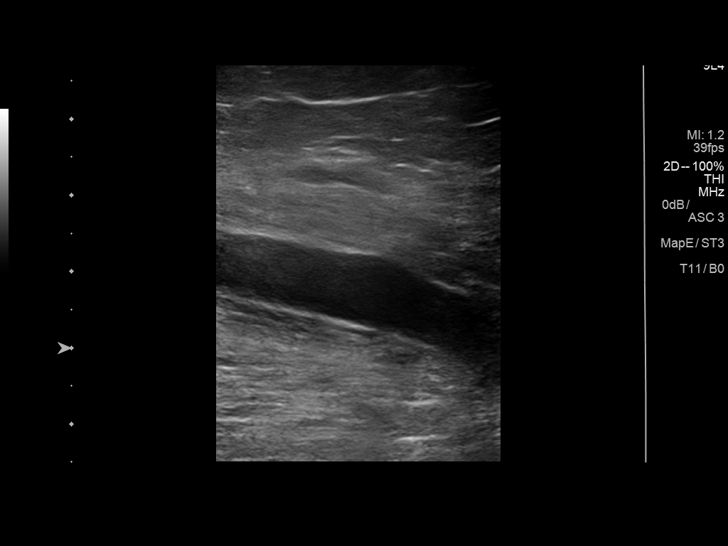
[im 58/71]
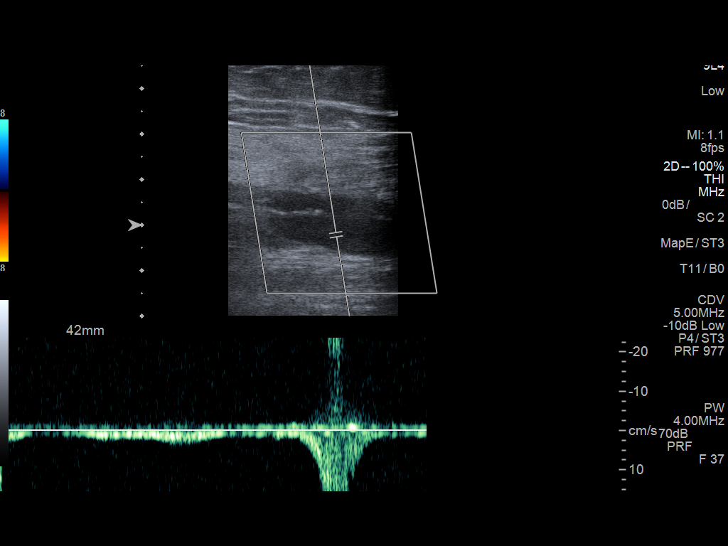
[im 64/71]
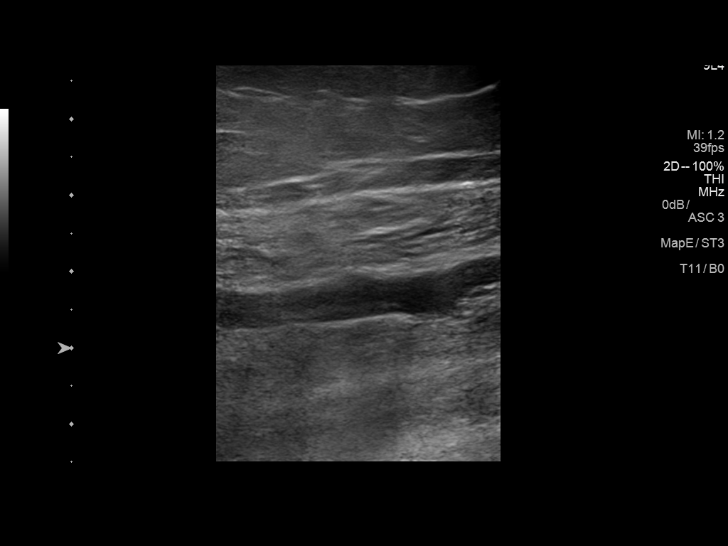
[im 71/71]
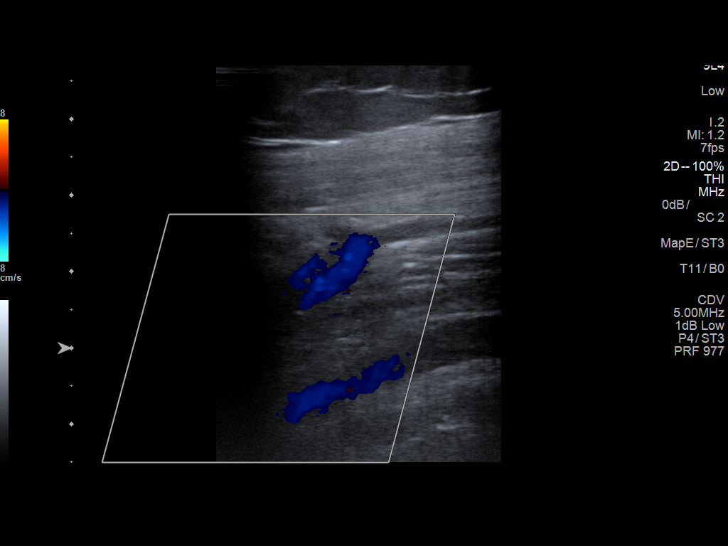

[13 of 24 positions shown; findings below may reference images not displayed]

FINDINGS: RIGHT LOWER EXTREMITY

Common Femoral Vein: No evidence of thrombus. Normal
compressibility, respiratory phasicity and response to augmentation.

Saphenofemoral Junction: No evidence of thrombus. Normal
compressibility and flow on color Doppler imaging.

Profunda Femoral Vein: No evidence of thrombus. Normal
compressibility and flow on color Doppler imaging.

Femoral Vein: No evidence of thrombus. Normal compressibility,
respiratory phasicity and response to augmentation.

Popliteal Vein: No evidence of thrombus. Normal compressibility,
respiratory phasicity and response to augmentation.

Calf Veins: No evidence of thrombus. Normal compressibility and flow
on color Doppler imaging.

Superficial Great Saphenous Vein: No evidence of thrombus. Normal
compressibility and flow on color Doppler imaging.

Venous Reflux:  None.

Other Findings: 3.5 x 1.6 x 4.2 cm cyst in the right popliteal fossa
consistent with Baker cyst.

LEFT LOWER EXTREMITY

Common Femoral Vein: No evidence of thrombus. Normal
compressibility, respiratory phasicity and response to augmentation.

Saphenofemoral Junction: No evidence of thrombus. Normal
compressibility and flow on color Doppler imaging.

Profunda Femoral Vein: No evidence of thrombus. Normal
compressibility and flow on color Doppler imaging.

Femoral Vein: No evidence of thrombus. Normal compressibility,
respiratory phasicity and response to augmentation.

Popliteal Vein: No evidence of thrombus. Normal compressibility,
respiratory phasicity and response to augmentation.

Calf Veins: No evidence of thrombus. Normal compressibility and flow
on color Doppler imaging.

Superficial Great Saphenous Vein: No evidence of thrombus. Normal
compressibility and flow on color Doppler imaging.

Venous Reflux:  None.

Other Findings:  None.
IMPRESSION: 1. No evidence of deep venous thrombosis.

2.  3.5 x 1.6 x 4.2 cm right Baker cyst.

## 2016-11-10 ENCOUNTER — Encounter: Payer: Self-pay | Admitting: *Deleted

## 2016-11-13 ENCOUNTER — Ambulatory Visit: Payer: Medicare PPO | Admitting: Cardiovascular Disease

## 2016-11-22 ENCOUNTER — Ambulatory Visit (INDEPENDENT_AMBULATORY_CARE_PROVIDER_SITE_OTHER): Payer: Medicare PPO | Admitting: Cardiovascular Disease

## 2016-11-22 ENCOUNTER — Encounter: Payer: Self-pay | Admitting: Cardiovascular Disease

## 2016-11-22 VITALS — BP 145/82 | HR 54 | Ht 62.0 in | Wt 143.0 lb

## 2016-11-22 DIAGNOSIS — I482 Chronic atrial fibrillation: Secondary | ICD-10-CM | POA: Diagnosis not present

## 2016-11-22 DIAGNOSIS — I4891 Unspecified atrial fibrillation: Secondary | ICD-10-CM | POA: Diagnosis not present

## 2016-11-22 DIAGNOSIS — Z5181 Encounter for therapeutic drug level monitoring: Secondary | ICD-10-CM

## 2016-11-22 DIAGNOSIS — I4821 Permanent atrial fibrillation: Secondary | ICD-10-CM

## 2016-11-22 NOTE — Patient Instructions (Signed)
Your physician wants you to follow-up in: 1 YEAR WITH DR KONESWARAN You will receive a reminder letter in the mail two months in advance. If you don't receive a letter, please call our office to schedule the follow-up appointment.  Your physician recommends that you continue on your current medications as directed. Please refer to the Current Medication list given to you today.  Thank you for choosing [facility]!!    

## 2016-11-22 NOTE — Progress Notes (Signed)
SUBJECTIVE: The patient presents for follow-up of atrial fibrillation.  ECG performed in the office today which I ordered and personally interpreted demonstrated rate controlled atrial fibrillation.  She is doing well and denies chest pain, palpitations, and shortness of breath. She denies bleeding problems with apixaban. She has some nasal congestion due to seasonal allergies.   Review of Systems: As per "subjective", otherwise negative.    Current Outpatient Prescriptions  Medication Sig Dispense Refill  . apixaban (ELIQUIS) 5 MG TABS tablet TAKE (1) TABLET TWICE DAILY. 60 tablet 4  . bimatoprost (LUMIGAN) 0.01 % SOLN Place 1 drop into both eyes at bedtime.    . furosemide (LASIX) 20 MG tablet Take 1 tablet (20 mg total) by mouth daily as needed for edema. 30 tablet 6  . metoprolol tartrate (LOPRESSOR) 25 MG tablet Take 0.5 tablets (12.5 mg total) by mouth 2 (two) times daily. 30 tablet 4   No current facility-administered medications for this visit.     Past Medical History:  Diagnosis Date  . A-fib (HCC)   . Chronic kidney disease    hx kidney infection  . Hernia, abdominal   . Hypertension   . Shortness of breath     Past Surgical History:  Procedure Laterality Date  . APPENDECTOMY    . CATARACT EXTRACTION W/PHACO Right 08/28/2012   Procedure: CATARACT EXTRACTION PHACO AND INTRAOCULAR LENS PLACEMENT (IOC);  Surgeon: Chalmers Guestoy Whitaker, MD;  [facility];  Service: Ophthalmology;  Laterality: Right;  . HERNIA REPAIR     umbilical  . TUBAL LIGATION      Social History   Social History  . Marital status: Widowed    Spouse name: N/A  . Number of children: N/A  . Years of education: N/A   Occupational History  . Not on file.   Social History Main Topics  . Smoking status: Never Smoker  . Smokeless tobacco: Never Used  . Alcohol use No  . Drug use: No  . Sexual activity: No   Other Topics Concern  . Not on file   Social History Narrative  . No  narrative on file     Vitals:   11/22/16 1110  Weight: 143 lb (64.9 kg)  Height: 5\' 2"  (1.575 m)    Wt Readings from Last 3 Encounters:  11/22/16 143 lb (64.9 kg)  11/05/15 153 lb (69.4 kg)  10/14/15 156 lb 11.2 oz (71.1 kg)     PHYSICAL EXAM General: NAD HEENT: Normal. Neck: No JVD, no thyromegaly. Lungs: Clear to auscultation bilaterally with normal respiratory effort. CV: Nondisplaced PMI.  Regular rate and irregular rhythm, normal S1/S2, no S3, no murmur. No pitting lower extremity edema. Abdomen: Soft, nontender, no distention.  Neurologic: Alert and oriented.  Psych: Normal affect. Skin: Normal. Musculoskeletal: No gross deformities.    ECG: Most recent ECG reviewed.   Labs: Lab Results  Component Value Date/Time   K 4.3 08/01/2016 03:16 PM   BUN 11 08/01/2016 03:16 PM   CREATININE 0.99 (H) 08/01/2016 03:16 PM   ALT 20 10/14/2015 12:56 PM   HGB 15.2 08/01/2016 03:16 PM     Lipids: Lab Results  Component Value Date/Time   LDLCALC 113 (H) 10/15/2015 06:05 AM   CHOL 193 10/15/2015 06:05 AM   TRIG 99 10/15/2015 06:05 AM   HDL 60 10/15/2015 06:05 AM       ASSESSMENT AND PLAN:  1. Atrial fibrillation: Symptomatically stable. HR controlled on metoprolol 12.5 mg twice daily. Anticoagulated  with Eliquis 5 mg bid. No changes to therapy.     Disposition: Follow up 1 yr   Prentice Docker, M.D., F.A.C.C.

## 2016-11-29 ENCOUNTER — Other Ambulatory Visit: Payer: Self-pay | Admitting: Cardiovascular Disease

## 2017-05-23 ENCOUNTER — Other Ambulatory Visit: Payer: Self-pay | Admitting: Cardiovascular Disease

## 2017-07-28 ENCOUNTER — Other Ambulatory Visit: Payer: Self-pay | Admitting: Cardiovascular Disease

## 2017-09-04 ENCOUNTER — Other Ambulatory Visit: Payer: Self-pay | Admitting: Cardiovascular Disease

## 2017-11-20 ENCOUNTER — Other Ambulatory Visit: Payer: Self-pay | Admitting: Cardiovascular Disease

## 2017-11-21 ENCOUNTER — Ambulatory Visit: Payer: Medicare PPO | Admitting: Cardiovascular Disease

## 2017-12-20 ENCOUNTER — Other Ambulatory Visit: Payer: Self-pay | Admitting: Cardiovascular Disease

## 2018-01-15 ENCOUNTER — Ambulatory Visit: Payer: Medicare PPO | Admitting: Cardiovascular Disease

## 2018-02-14 ENCOUNTER — Other Ambulatory Visit: Payer: Self-pay | Admitting: Cardiovascular Disease

## 2018-03-01 ENCOUNTER — Encounter: Payer: Self-pay | Admitting: Cardiovascular Disease

## 2018-03-01 ENCOUNTER — Ambulatory Visit (INDEPENDENT_AMBULATORY_CARE_PROVIDER_SITE_OTHER): Payer: Medicare PPO | Admitting: Cardiovascular Disease

## 2018-03-01 VITALS — BP 118/72 | HR 61 | Ht 62.0 in | Wt 129.0 lb

## 2018-03-01 DIAGNOSIS — I4821 Permanent atrial fibrillation: Secondary | ICD-10-CM | POA: Diagnosis not present

## 2018-03-01 DIAGNOSIS — R079 Chest pain, unspecified: Secondary | ICD-10-CM

## 2018-03-01 DIAGNOSIS — R0609 Other forms of dyspnea: Secondary | ICD-10-CM | POA: Diagnosis not present

## 2018-03-01 NOTE — Patient Instructions (Signed)
Medication Instructions:  Your physician recommends that you continue on your current medications as directed. Please refer to the Current Medication list given to you today.  If you need a refill on your cardiac medications before your next appointment, please call your pharmacy.   Lab work: None If you have labs (blood work) drawn today and your tests are completely normal, you will receive your results only by: Marland Kitchen. MyChart Message (if you have MyChart) OR . A paper copy in the mail If you have any lab test that is abnormal or we need to change your treatment, we will call you to review the results.  Testing/Procedures: Your physician has requested that you have an echocardiogram. Echocardiography is a painless test that uses sound waves to create images of your heart. It provides your doctor with information about the size and shape of your heart and how well your heart's chambers and valves are working. This procedure takes approximately one hour. There are no restrictions for this procedure.    Your physician has requested that you have a lexiscan myoview. For further information please visit [email or web address]. Please follow instruction sheet, as given.     Follow-Up: At [facility], you and your health needs are our priority.  As part of our continuing mission to provide you with exceptional heart care, we have created designated Provider Care Teams.  These Care Teams include your primary Cardiologist (physician) and Advanced Practice Providers (APPs -  Physician Assistants and Nurse Practitioners) who all work together to provide you with the care you need, when you need it. You will need a follow up appointment in 10 weeks.  Please call our office 2 months in advance to schedule this appointment.  You may see Prentice DockerSuresh Koneswaran, MD or one of the following Advanced Practice Providers on your designated Care Team:   Randall AnBrittany Strader, PA-C [facility]([place] Office) . Jacolyn ReedyMichele Lenze, PA-C  [facility]([place] Office)  Any Other Special Instructions Will Be Listed Below (If Applicable). NONE

## 2018-03-01 NOTE — Progress Notes (Signed)
SUBJECTIVE: The patient presents for follow-up of permanent atrial fibrillation.  ECG performed in the office today which I ordered and personally interpreted demonstrates rate controlled atrial flutter/fibrillation.  She is noticed for the past 3 or 4 months she has become more short of breath with minimal exertion.  She has occasional associated chest tightness.  She is also had a head cold with headaches and a cough for the past several weeks.  Echocardiogram on 10/15/2015 demonstrated vigorous left ventricular systolic function, LVEF 65 to 16%.     Review of Systems: As per "subjective", otherwise negative.    Current Outpatient Medications  Medication Sig Dispense Refill  . ELIQUIS 5 MG TABS tablet TAKE (1) TABLET TWICE DAILY. 60 tablet 2  . furosemide (LASIX) 20 MG tablet TAKE 1 TABLET ONCE DAILY AS NEEDED FOR EDEMA 30 tablet 2  . metoprolol tartrate (LOPRESSOR) 25 MG tablet TAKE (1/2) TABLET BY MOUTH 2 TIMES A DAY. 30 tablet 6  . bimatoprost (LUMIGAN) 0.01 % SOLN Place 1 drop into both eyes at bedtime.     No current facility-administered medications for this visit.     Past Medical History:  Diagnosis Date  . A-fib (HCC)   . Chronic kidney disease    hx kidney infection  . Hernia, abdominal   . Hypertension   . Shortness of breath     Past Surgical History:  Procedure Laterality Date  . APPENDECTOMY    . CATARACT EXTRACTION W/PHACO Right 08/28/2012   Procedure: CATARACT EXTRACTION PHACO AND INTRAOCULAR LENS PLACEMENT (IOC);  Surgeon: Chalmers Guest, MD;  Location: [facility] OR;  Service: Ophthalmology;  Laterality: Right;  . HERNIA REPAIR     umbilical  . TUBAL LIGATION      Social History   Socioeconomic History  . Marital status: Widowed    Spouse name: Not on file  . Number of children: Not on file  . Years of education: Not on file  . Highest education level: Not on file  Occupational History  . Not on file  Social Needs  . Financial resource  strain: Not on file  . Food insecurity:    Worry: Not on file    Inability: Not on file  . Transportation needs:    Medical: Not on file    Non-medical: Not on file  Tobacco Use  . Smoking status: Never Smoker  . Smokeless tobacco: Never Used  Substance and Sexual Activity  . Alcohol use: No    Alcohol/week: 0.0 standard drinks  . Drug use: No  . Sexual activity: Never  Lifestyle  . Physical activity:    Days per week: Not on file    Minutes per session: Not on file  . Stress: Not on file  Relationships  . Social connections:    Talks on phone: Not on file    Gets together: Not on file    Attends religious service: Not on file    Active member of club or organization: Not on file    Attends meetings of clubs or organizations: Not on file    Relationship status: Not on file  . Intimate partner violence:    Fear of current or ex partner: Not on file    Emotionally abused: Not on file    Physically abused: Not on file    Forced sexual activity: Not on file  Other Topics Concern  . Not on file  Social History Narrative  . Not on file  Vitals:   03/01/18 1259  BP: 118/72  Pulse: 61  SpO2: 98%  Weight: 129 lb (58.5 kg)  Height: 5\' 2"  (1.575 m)    Wt Readings from Last 3 Encounters:  03/01/18 129 lb (58.5 kg)  11/22/16 143 lb (64.9 kg)  11/05/15 153 lb (69.4 kg)     PHYSICAL EXAM General: NAD HEENT: Normal. Neck: No JVD, no thyromegaly. Lungs: Clear to auscultation bilaterally with normal respiratory effort. CV: Regular rate and irregular rhythm, normal S1/S2, no S3, 2/6 systolic murmur along left sternal border. No pretibial or periankle edema.  No carotid bruit.   Abdomen: Soft, nontender, no distention.  Neurologic: Alert and oriented.  Psych: Normal affect. Skin: Normal. Musculoskeletal: No gross deformities.    ECG: Reviewed above under Subjective   Labs: Lab Results  Component Value Date/Time   K 4.3 08/01/2016 03:16 PM   BUN 11  08/01/2016 03:16 PM   CREATININE 0.99 (H) 08/01/2016 03:16 PM   ALT 20 10/14/2015 12:56 PM   HGB 15.2 08/01/2016 03:16 PM     Lipids: Lab Results  Component Value Date/Time   LDLCALC 113 (H) 10/15/2015 06:05 AM   CHOL 193 10/15/2015 06:05 AM   TRIG 99 10/15/2015 06:05 AM   HDL 60 10/15/2015 06:05 AM       ASSESSMENT AND PLAN: 1.  Permanent atrial fibrillation/flutter: Symptomatically stable.  Heart rate is controlled on metoprolol tartrate 12.5 mg twice daily.  Anticoagulated with Eliquis 5 mg twice daily.  No changes to therapy.  2.  Exertional dyspnea and chest tightness: Symptoms have developed over the last 3 to 4 months.  She had vigorous left ventricular systolic function in June 2017.  I will obtain an echocardiogram to assess for interval changes in cardiac structure and function. I will proceed with a nuclear myocardial perfusion imaging study to evaluate for ischemic heart disease (Lexiscan Myoview).   Disposition: Follow up 10 weeks   Prentice DockerSuresh Isobelle Tuckett, M.D., F.A.C.C.

## 2018-03-08 ENCOUNTER — Ambulatory Visit (HOSPITAL_COMMUNITY): Admission: RE | Admit: 2018-03-08 | Payer: Medicare PPO | Source: Ambulatory Visit

## 2018-03-08 ENCOUNTER — Encounter (HOSPITAL_COMMUNITY): Payer: Medicare PPO

## 2018-03-20 ENCOUNTER — Encounter (HOSPITAL_BASED_OUTPATIENT_CLINIC_OR_DEPARTMENT_OTHER)
Admission: RE | Admit: 2018-03-20 | Discharge: 2018-03-20 | Disposition: A | Discharge status: Home / Self Care | Payer: Medicare PPO | Source: Ambulatory Visit | Attending: Cardiovascular Disease | Admitting: Cardiovascular Disease

## 2018-03-20 ENCOUNTER — Ambulatory Visit (HOSPITAL_COMMUNITY)
Admission: RE | Admit: 2018-03-20 | Discharge: 2018-03-20 | Disposition: A | Discharge status: Home / Self Care | Payer: Medicare PPO | Source: Ambulatory Visit | Attending: Cardiovascular Disease | Admitting: Cardiovascular Disease

## 2018-03-20 ENCOUNTER — Encounter (HOSPITAL_COMMUNITY)
Admission: RE | Admit: 2018-03-20 | Discharge: 2018-03-20 | Disposition: A | Discharge status: Home / Self Care | Payer: Medicare PPO | Source: Ambulatory Visit | Attending: Cardiovascular Disease | Admitting: Cardiovascular Disease

## 2018-03-20 DIAGNOSIS — R079 Chest pain, unspecified: Secondary | ICD-10-CM

## 2018-03-20 DIAGNOSIS — R0609 Other forms of dyspnea: Secondary | ICD-10-CM

## 2018-03-20 LAB — NM MYOCAR MULTI W/SPECT W/WALL MOTION / EF
LV dias vol: 56 mL (ref 46–106)
LV sys vol: 17 mL
Peak HR: 76 {beats}/min
RATE: 0.46
Rest HR: 55 {beats}/min
SDS: 1
SRS: 0
SSS: 1
TID: 0.93

## 2018-03-20 MED ORDER — TECHNETIUM TC 99M TETROFOSMIN IV KIT
30.0000 | PACK | Freq: Once | INTRAVENOUS | Status: AC | PRN
  Administered 2018-03-20: 30 via INTRAVENOUS

## 2018-03-20 MED ORDER — REGADENOSON 0.4 MG/5ML IV SOLN
INTRAVENOUS | Status: AC
  Administered 2018-03-20: 0.4 mg via INTRAVENOUS
  Filled 2018-03-20: qty 5

## 2018-03-20 MED ORDER — SODIUM CHLORIDE 0.9% FLUSH
INTRAVENOUS | Status: AC
  Administered 2018-03-20: 10 mL via INTRAVENOUS
  Filled 2018-03-20: qty 10

## 2018-03-20 MED ORDER — TECHNETIUM TC 99M TETROFOSMIN IV KIT
10.0000 | PACK | Freq: Once | INTRAVENOUS | Status: AC | PRN
  Administered 2018-03-20: 10 via INTRAVENOUS

## 2018-03-20 NOTE — Progress Notes (Signed)
*  PRELIMINARY RESULTS* Echocardiogram 2D Echocardiogram has been performed.  Stacey DrainWhite, Khaalid Lefkowitz J 03/20/2018, 1:36 PM

## 2018-03-26 ENCOUNTER — Other Ambulatory Visit: Payer: Self-pay | Admitting: Cardiovascular Disease

## 2018-03-26 MED ORDER — METOPROLOL TARTRATE 25 MG PO TABS: ORAL_TABLET | ORAL | 6 refills | Status: DC

## 2018-03-26 MED ORDER — APIXABAN 5 MG PO TABS: ORAL_TABLET | ORAL | 6 refills | Status: DC

## 2018-03-26 MED ORDER — FUROSEMIDE 20 MG PO TABS: ORAL_TABLET | ORAL | 2 refills | Status: DC

## 2018-03-26 NOTE — Telephone Encounter (Signed)
[facility] [place], pt has changed pharmacy will need refills

## 2018-05-17 ENCOUNTER — Ambulatory Visit: Payer: Medicare PPO | Admitting: Cardiovascular Disease

## 2018-07-08 ENCOUNTER — Telehealth: Payer: Self-pay | Admitting: Cardiovascular Disease

## 2018-07-08 DIAGNOSIS — R600 Localized edema: Secondary | ICD-10-CM

## 2018-07-08 MED ORDER — FUROSEMIDE 40 MG PO TABS: 40.0000 mg | ORAL_TABLET | ORAL | 6 refills | Status: DC | PRN

## 2018-07-08 MED ORDER — POTASSIUM CHLORIDE CRYS ER 20 MEQ PO TBCR: 20.0000 meq | EXTENDED_RELEASE_TABLET | Freq: Every day | ORAL | 6 refills | Status: DC

## 2018-07-08 NOTE — Addendum Note (Signed)
Addended by: Lesle Chris on: 07/08/2018 05:41 PM   Modules accepted: Orders

## 2018-07-08 NOTE — Telephone Encounter (Signed)
I called and spoke with both the patient and her youngest son, Casimiro Needle, who brings the patient to her appointments.  I called about rescheduling her appointment due to the COVID19 pandemic.  She has been having some bilateral leg swelling and taking 20 mg of Lasix as needed but her son notes that it has not been as effective as it had been.  I instructed them to take 40 mg of Lasix as needed and to weigh herself daily.  If weight were to increase by 3 to 5 pounds in 24 hours, it would be permissible to take an extra 40 mg of Lasix in the late afternoon.  I will also prescribe 20 mEq of potassium chloride to be taken daily.  I will also order a basic metabolic panel to be obtained within the next several days.  Both her son and the patient are agreeable to rescheduling her appointment.  My office staff will call her back.

## 2018-07-08 NOTE — Telephone Encounter (Signed)
Discussed directions with son Casimiro Needle).  New prescriptions sent to [facility].  He will take her do labs on Thursday, 07/11/18 at [place] across from [facility].  OV rescheduled till 08/12/2018 in [place] office.

## 2018-07-09 ENCOUNTER — Telehealth: Payer: Self-pay | Admitting: Cardiovascular Disease

## 2018-07-09 NOTE — Telephone Encounter (Signed)
Yes, that would be fine.

## 2018-07-09 NOTE — Telephone Encounter (Signed)
Patient's son Casimiro Needle) called stating that the pharmacy is out of the Potassium. She will be picking up medication on Wednesday. Patient is concerned about having blood work on Thursday without taking the Potassium.

## 2018-07-09 NOTE — Telephone Encounter (Signed)
Pt made aware

## 2018-07-09 NOTE — Telephone Encounter (Signed)
Should she wait a few days after starting potassium?

## 2018-07-10 ENCOUNTER — Telehealth: Payer: Self-pay | Admitting: Cardiovascular Disease

## 2018-07-10 MED ORDER — POTASSIUM CHLORIDE CRYS ER 20 MEQ PO TBCR: 20.0000 meq | EXTENDED_RELEASE_TABLET | Freq: Every day | ORAL | 6 refills | Status: DC

## 2018-07-10 NOTE — Telephone Encounter (Signed)
Medication sent to pharmacy  

## 2018-07-10 NOTE — Telephone Encounter (Signed)
furosemide (LASIX) 40 MG tablet   Sent to wrong pharmacy Needs to be sent to [facility] on [address]

## 2018-07-12 ENCOUNTER — Ambulatory Visit: Payer: Medicare HMO | Admitting: Cardiovascular Disease

## 2018-07-12 ENCOUNTER — Telehealth: Payer: Self-pay | Admitting: Cardiovascular Disease

## 2018-07-12 MED ORDER — POTASSIUM CHLORIDE CRYS ER 20 MEQ PO TBCR: 20.0000 meq | EXTENDED_RELEASE_TABLET | ORAL | Status: DC | PRN

## 2018-07-12 NOTE — Telephone Encounter (Signed)
Casimiro Needle (son) notified - ok per Dr. Purvis Sheffield to only take the Potassium on the days she takes the Lasix.  He verbalized understanding.

## 2018-07-12 NOTE — Telephone Encounter (Signed)
Casimiro Needle (son) called in regards to patient taking the Lasix and Potassium if she is not having swelling.

## 2018-07-16 LAB — BASIC METABOLIC PANEL
BUN/Creatinine Ratio: 18 (calc) (ref 6–22)
BUN: 26 mg/dL — ABNORMAL HIGH (ref 7–25)
CO2: 29 mmol/L (ref 20–32)
Calcium: 9.3 mg/dL (ref 8.6–10.4)
Chloride: 101 mmol/L (ref 98–110)
Creat: 1.44 mg/dL — ABNORMAL HIGH (ref 0.60–0.88)
GLUCOSE: 86 mg/dL (ref 65–139)
Potassium: 4.6 mmol/L (ref 3.5–5.3)
Sodium: 140 mmol/L (ref 135–146)

## 2018-07-17 ENCOUNTER — Telehealth: Payer: Self-pay | Admitting: *Deleted

## 2018-07-17 DIAGNOSIS — R0609 Other forms of dyspnea: Secondary | ICD-10-CM

## 2018-07-17 DIAGNOSIS — R600 Localized edema: Secondary | ICD-10-CM

## 2018-07-17 NOTE — Telephone Encounter (Signed)
No pcp to fwd lab to.    Notes recorded by Lesle Chris, LPN on 0/09/2374 at 2:13 PM EDT Son Casimiro Needle) notified. Will put order in for [facility] - they will do in 6 weeks. ------  Notes recorded by Laqueta Linden, MD on 07/16/2018 at 11:28 AM EDT Creatinine elevated indicative of increased diuretic requirement. Send copy to PCP as well. This will need continued monitoring. Repeat in 6 weeks.

## 2018-07-29 ENCOUNTER — Telehealth: Payer: Self-pay | Admitting: Cardiovascular Disease

## 2018-07-29 NOTE — Telephone Encounter (Signed)
Consent read to patient. ° °YOUR CARDIOLOGY TEAM HAS ARRANGED FOR AN E-VISIT FOR YOUR APPOINTMENT - PLEASE REVIEW IMPORTANT INFORMATION BELOW SEVERAL DAYS PRIOR TO YOUR APPOINTMENT ° °Due to the recent COVID-19 pandemic, we are transitioning in-person office visits to tele-medicine visits in an effort to decrease unnecessary exposure to our patients and staff. Medicare and most insurances are covering these visits without a copay needed. We also encourage you to sign up for MyChart if you have not already done so. You will need a smartphone if possible. For patients that do not have this, we can still complete the visit using a regular telephone but do prefer a smartphone to enable video when possible. You may have a close family member that lives with you that can help. If possible, we also ask that you have a blood pressure cuff and scale at home to measure your blood pressure, heart rate and weight prior to your scheduled appointment. Patients with clinical needs that need an in-person evaluation and testing will still be able to come to the office if absolutely necessary. If you have any questions, feel free to call our office. ° ° ° °IF YOU HAVE A SMARTPHONE, PLEASE DOWNLOAD THE WEBEX APP TO YOUR SMARTPHONE ° °- If Apple, go to App Store and type in WebEx in the search bar. Download [employer] Webex Meetings, the blue/green circle. The app is free but as with any other app download, your phone may require you to verify saved payment information or Apple password. You do NOT have to create a WebEx account. ° °- If Android, go to Google Play Store and type in WebEx in the search bar. Download [employer] Webex Meetings, the blue/green circle. The app is free but as with any other app download, your phone may require you to verify saved payment information or Android password. You do NOT have to create a WebEx account. ° °It is very helpful to have this downloaded before your visit. ° ° ° °2-3 DAYS BEFORE YOUR  APPOINTMENT ° °You will receive a telephone call from one of our [facility] team members - your caller ID may say "Unknown caller." If this is a video visit, we will confirm that you have been able to download the WebEx app. We will remind you check your blood pressure, heart rate and weight prior to your scheduled appointment. If you have an Apple Watch or Kardia, please upload any pertinent ECG strips the day before or morning of your appointment to MyChart. Our staff will also make sure you have reviewed the consent and agree to move forward with your scheduled tele-health visit.  ° ° ° °THE DAY OF YOUR APPOINTMENT ° °Approximately 15 minutes prior to your scheduled appointment, you will receive a telephone call from one of HeartCare team - your caller ID may say "Unknown caller."  Our staff will confirm medications, vital signs for the day and any symptoms you may be experiencing. Please have this information available prior to the time of visit start. It may also be helpful for you to have a pad of paper and pen handy for any instructions given during your visit. They will also walk you through joining the WebEx smartphone meeting if this is a video visit. ° ° ° °CONSENT FOR TELE-HEALTH VISIT - PLEASE REVIEW ° °I hereby voluntarily request, consent and authorize [facility] and its employed or contracted physicians, physician assistants, nurse practitioners or other licensed health care professionals (the Practitioner), to provide me with telemedicine health   care services (the “Services") as deemed necessary by the treating Practitioner. I acknowledge and consent to receive the Services by the Practitioner via telemedicine. I understand that the telemedicine visit will involve communicating with the Practitioner through live audiovisual communication technology and the disclosure of certain medical information by electronic transmission. I acknowledge that I have been given the opportunity to request an  in-person assessment or other available alternative prior to the telemedicine visit and am voluntarily participating in the telemedicine visit. ° °I understand that I have the right to withhold or withdraw my consent to the use of telemedicine in the course of my care at any time, without affecting my right to future care or treatment, and that the Practitioner or I may terminate the telemedicine visit at any time. I understand that I have the right to inspect all information obtained and/or recorded in the course of the telemedicine visit and may receive copies of available information for a reasonable fee.  I understand that some of the potential risks of receiving the Services via telemedicine include:  °• Delay or interruption in medical evaluation due to technological equipment failure or disruption; °• Information transmitted may not be sufficient (e.g. poor resolution of images) to allow for appropriate medical decision making by the Practitioner; and/or  °• In rare instances, security protocols could fail, causing a breach of personal health information. ° °Furthermore, I acknowledge that it is my responsibility to provide information about my medical history, conditions and care that is complete and accurate to the best of my ability. I acknowledge that Practitioner's advice, recommendations, and/or decision may be based on factors not within their control, such as incomplete or inaccurate data provided by me or distortions of diagnostic images or specimens that may result from electronic transmissions. I understand that the practice of medicine is not an exact science and that Practitioner makes no warranties or guarantees regarding treatment outcomes. I acknowledge that I will receive a copy of this consent concurrently upon execution via email to the email address I last provided but may also request a printed copy by calling the office of [facility].   ° °I understand that my insurance will be  billed for this visit.  ° °I have read or had this consent read to me. °• I understand the contents of this consent, which adequately explains the benefits and risks of the Services being provided via telemedicine.  °• I have been provided ample opportunity to ask questions regarding this consent and the Services and have had my questions answered to my satisfaction. °• I give my informed consent for the services to be provided through the use of telemedicine in my medical care ° °By participating in this telemedicine visit I agree to the above. ° °

## 2018-08-01 ENCOUNTER — Telehealth: Payer: Medicare HMO | Admitting: Physician Assistant

## 2018-08-12 ENCOUNTER — Ambulatory Visit: Payer: Medicare HMO | Admitting: Cardiology

## 2018-09-11 ENCOUNTER — Other Ambulatory Visit: Payer: Self-pay | Admitting: Cardiovascular Disease

## 2018-09-13 ENCOUNTER — Other Ambulatory Visit: Payer: Self-pay | Admitting: Cardiovascular Disease

## 2018-09-13 ENCOUNTER — Telehealth: Payer: Self-pay | Admitting: Cardiovascular Disease

## 2018-09-13 MED ORDER — FUROSEMIDE 40 MG PO TABS: 40.0000 mg | ORAL_TABLET | Freq: Every day | ORAL | 1 refills | Status: DC | PRN

## 2018-09-13 NOTE — Telephone Encounter (Signed)
Casimiro Needle (son) called stating that patient is taking 80 mg of Lasix daily. They need a new RX called in so that patient will have enough to take.   [facility]

## 2018-09-13 NOTE — Telephone Encounter (Signed)
Son informed that rx was sent to [facility]. Apologies expressed and advised that new rx sent to [facility]

## 2018-09-13 NOTE — Telephone Encounter (Signed)
Mr. Dow called stating that [facility] pharmacy told him that they have not received any refills from our office today.

## 2018-09-13 NOTE — Telephone Encounter (Signed)
Medication sent to pharmacy. LM with son that we sent rx to [facility]

## 2018-09-13 NOTE — Telephone Encounter (Signed)
Casimiro Needle (son) called stating that patient is taking 80 mg of Lasix daily. They need a new RX called in so that patient will have enough to take.  [facility] [place]     Refill was sent to [facility]

## 2018-10-21 ENCOUNTER — Other Ambulatory Visit: Payer: Self-pay | Admitting: Cardiovascular Disease

## 2018-10-29 ENCOUNTER — Ambulatory Visit (INDEPENDENT_AMBULATORY_CARE_PROVIDER_SITE_OTHER): Payer: Medicare HMO | Admitting: Cardiovascular Disease

## 2018-10-29 ENCOUNTER — Other Ambulatory Visit: Payer: Self-pay

## 2018-10-29 ENCOUNTER — Encounter: Payer: Self-pay | Admitting: Cardiovascular Disease

## 2018-10-29 VITALS — BP 100/72 | HR 59 | Ht 61.0 in | Wt 130.0 lb

## 2018-10-29 DIAGNOSIS — R600 Localized edema: Secondary | ICD-10-CM

## 2018-10-29 DIAGNOSIS — I4821 Permanent atrial fibrillation: Secondary | ICD-10-CM | POA: Diagnosis not present

## 2018-10-29 MED ORDER — TORSEMIDE 20 MG PO TABS: 20.0000 mg | ORAL_TABLET | Freq: Two times a day (BID) | ORAL | 1 refills | Status: DC

## 2018-10-29 MED ORDER — POTASSIUM CHLORIDE CRYS ER 20 MEQ PO TBCR: 20.0000 meq | EXTENDED_RELEASE_TABLET | Freq: Every day | ORAL | 1 refills | Status: DC

## 2018-10-29 NOTE — Patient Instructions (Addendum)
Medication Instructions:   Your physician has recommended you make the following change in your medication:   Stop furosemide  Start torsemide 20 mg by mouth daily-call office in 6 weeks to update [place] on symptoms of swelling please.  Potassium 20 meq by mouth daily  Continue all other medications the same  Labwork:  Your physician recommends that you return for lab work on Friday, November 01, 2018 to check your BMET. You may have this done at [facility]. Your lab order has been given to you today.  Testing/Procedures:  NONE  Follow-Up:  Your physician recommends that you schedule a follow-up appointment in: 6 months with an extender. You will receive a reminder letter in the mail in about 4 months reminding you to call and schedule your appointment. If you don't receive this letter, please contact our office.  Any Other Special Instructions Will Be Listed Below (If Applicable).  If you need a refill on your cardiac medications before your next appointment, please call your pharmacy.

## 2018-10-29 NOTE — Progress Notes (Signed)
SUBJECTIVE: The patient presents for follow-up of permanent atrial fibrillation.  Echocardiogram on 03/20/2018 demonstrated vigorous left ventricular systolic function and normal regional wall motion, LVEF 65 to 70%.  She underwent a normal nuclear stress test on 03/20/2018.  She is here with her son, Legrand Como.  He says that she eats a lot of foods saturated in sodium.  She likes to eat [employer] roast beef sandwiches.  She will go to her PCP.  Her biggest issue has been bilateral leg edema.  She takes anywhere between 40 and 80 mg of Lasix daily with supplemental potassium.  She keeps her legs elevated in the evening.  Fluid levels fluctuate.  She denies chest pain.   Review of Systems: As per "subjective", otherwise negative.     Current Outpatient Medications  Medication Sig Dispense Refill  . bimatoprost (LUMIGAN) 0.01 % SOLN Place 1 drop into both eyes at bedtime.    Marland Kitchen ELIQUIS 5 MG TABS tablet Take 1 tablet by mouth twice daily 60 tablet 0  . furosemide (LASIX) 40 MG tablet Take 1 tablet (40 mg total) by mouth daily as needed for edema (SWELLING). (MAY TAKE AN EXTRA 40MG  AS NEEDED IN THE LATE AFTERNOON IF WEIGHT INCREASES BY 3-5 PPOUNDS IN 24 HOUR TIME FRAME) (Patient taking differently: Take 40 mg by mouth 2 (two) times daily. ) 180 tablet 1  . metoprolol tartrate (LOPRESSOR) 25 MG tablet Take 1/2 (one-half) tablet by mouth twice daily 90 tablet 0  . potassium chloride SA (K-DUR,KLOR-CON) 20 MEQ tablet Take 1 tablet (20 mEq total) by mouth as needed (only on the days you take the Lasix).     No current facility-administered medications for this visit.     Past Medical History:  Diagnosis Date  . A-fib ([place])   . Chronic kidney disease    hx kidney infection  . Hernia, abdominal   . Hypertension   . Shortness of breath     Past Surgical History:  Procedure Laterality Date  . APPENDECTOMY    . CATARACT EXTRACTION W/PHACO Right 08/28/2012   Procedure: CATARACT  EXTRACTION PHACO AND INTRAOCULAR LENS PLACEMENT (IOC);  Surgeon: Marylynn Pearson, MD;  Location: [place];  Service: Ophthalmology;  Laterality: Right;  . HERNIA REPAIR     umbilical  . TUBAL LIGATION      Social History   Socioeconomic History  . Marital status: Widowed    Spouse name: Not on file  . Number of children: Not on file  . Years of education: Not on file  . Highest education level: Not on file  Occupational History  . Not on file  Social Needs  . Financial resource strain: Not on file  . Food insecurity    Worry: Not on file    Inability: Not on file  . Transportation needs    Medical: Not on file    Non-medical: Not on file  Tobacco Use  . Smoking status: Never Smoker  . Smokeless tobacco: Never Used  Substance and Sexual Activity  . Alcohol use: No    Alcohol/week: 0.0 standard drinks  . Drug use: No  . Sexual activity: Never  Lifestyle  . Physical activity    Days per week: Not on file    Minutes per session: Not on file  . Stress: Not on file  Relationships  . Social Herbalist on phone: Not on file    Gets together: Not on file    Attends religious  service: Not on file    Active member of club or organization: Not on file    Attends meetings of clubs or organizations: Not on file    Relationship status: Not on file  . Intimate partner violence    Fear of current or ex partner: Not on file    Emotionally abused: Not on file    Physically abused: Not on file    Forced sexual activity: Not on file  Other Topics Concern  . Not on file  Social History Narrative  . Not on file     Vitals:   10/29/18 1419  BP: 100/72  Pulse: (!) 59  SpO2: 98%  Weight: 130 lb (59 kg)  Height: 5\' 1"  (1.549 m)    Wt Readings from Last 3 Encounters:  10/29/18 130 lb (59 kg)  03/01/18 129 lb (58.5 kg)  11/22/16 143 lb (64.9 kg)     PHYSICAL EXAM General: NAD HEENT: Normal. Neck: No JVD, no thyromegaly. Lungs: Clear to auscultation bilaterally with  normal respiratory effort. CV: Regular rate and irregular rhythm, normal S1/S2, no S3, no murmur.  1+ pitting bilateral lower extremity and dorsal pedal edema.   Abdomen: Soft, nontender, no distention.  Neurologic: Alert and oriented.  Psych: Normal affect. Skin: Normal. Musculoskeletal: No gross deformities.    ECG: Reviewed above under Subjective   Labs: Lab Results  Component Value Date/Time   K 4.6 07/15/2018 03:21 PM   BUN 26 (H) 07/15/2018 03:21 PM   CREATININE 1.44 (H) 07/15/2018 03:21 PM   ALT 20 10/14/2015 12:56 PM   HGB 15.2 08/01/2016 03:16 PM     Lipids: Lab Results  Component Value Date/Time   LDLCALC 113 (H) 10/15/2015 06:05 AM   CHOL 193 10/15/2015 06:05 AM   TRIG 99 10/15/2015 06:05 AM   HDL 60 10/15/2015 06:05 AM       ASSESSMENT AND PLAN:  1.  Permanent atrial fibrillation: Symptomatically stable.  Heart rate is controlled on Lopressor 12.5 mg twice daily.  Anticoagulated with apixaban 5 mg twice daily.  No changes.  2.  Bilateral leg edema: We talked about sodium restriction as this appears to be her biggest issue.  I will switch Lasix to torsemide 20 mg daily along with supplemental potassium 20 mEq daily.  I will check a basic metabolic panel within the next several days.   Disposition: Follow up 6 months with APP   Prentice DockerSuresh Ajahnae Rathgeber, M.D., F.A.C.C.

## 2018-11-06 ENCOUNTER — Telehealth: Payer: Self-pay | Admitting: Cardiovascular Disease

## 2018-11-06 NOTE — Telephone Encounter (Signed)
The clinic note and AVS say torsemide 20mg  once daily. Is she taking daily or bid. If taking bid as note says and no improvement would increase torsemide to 40mg  in AM and 20mg  in PM. If already took morning dose today go ahead and take an additional 20mg  now   Zandra Abts MD

## 2018-11-06 NOTE — Telephone Encounter (Signed)
Pt has been taking torsemide 20 mg bid - will increase to 40 mg in the morning and 20 mg in the evening. Will have labs done by Friday and update [place] if symptoms do no improve

## 2018-11-06 NOTE — Telephone Encounter (Signed)
Son called wanting to let Dr Bronson Ing know that they will try to get lab work by end of this week    Wants to discuss medication change.  They have not noticed any difference since starting new medication. Not urinating more or swelling not going down

## 2018-11-06 NOTE — Telephone Encounter (Signed)
Update from 7/17 office appt - Pt son says pt swelling has remained the same since starting torsemide 20 mg bid - no weight loss or gain - says she will have labs done by Friday at [facility]

## 2018-11-11 ENCOUNTER — Other Ambulatory Visit: Payer: Self-pay | Admitting: Cardiovascular Disease

## 2018-11-12 LAB — BASIC METABOLIC PANEL WITH GFR
BUN/Creatinine Ratio: 15 (calc) (ref 6–22)
BUN: 23 mg/dL (ref 7–25)
CO2: 28 mmol/L (ref 20–32)
Calcium: 8.9 mg/dL (ref 8.6–10.4)
Chloride: 100 mmol/L (ref 98–110)
Creat: 1.51 mg/dL — ABNORMAL HIGH (ref 0.60–0.88)
GFR, Est African American: 35 mL/min/{1.73_m2} — ABNORMAL LOW (ref >=60)
GFR, Est Non African American: 30 mL/min/{1.73_m2} — ABNORMAL LOW (ref >=60)
Glucose, Bld: 87 mg/dL (ref 65–139)
Potassium: 4 mmol/L (ref 3.5–5.3)
Sodium: 140 mmol/L (ref 135–146)

## 2018-11-15 ENCOUNTER — Telehealth: Payer: Self-pay | Admitting: Cardiovascular Disease

## 2018-11-15 NOTE — Telephone Encounter (Signed)
Pt son Legrand Como [facility]) made aware - routed to pcp

## 2018-11-15 NOTE — Telephone Encounter (Signed)
-----   Message from Herminio Commons, MD sent at 11/12/2018 10:43 AM EDT ----- Creatinine mildly increased from March which is to be expected given increased diuretic requirement recently.

## 2018-11-15 NOTE — Telephone Encounter (Signed)
Tracy [name] -son called requesting results of recent labs please call [phone]

## 2018-11-22 ENCOUNTER — Telehealth: Payer: Self-pay | Admitting: Cardiovascular Disease

## 2018-11-22 ENCOUNTER — Other Ambulatory Visit: Payer: Self-pay | Admitting: Cardiovascular Disease

## 2018-12-03 ENCOUNTER — Other Ambulatory Visit: Payer: Self-pay | Admitting: Cardiovascular Disease

## 2018-12-03 MED ORDER — TORSEMIDE 20 MG PO TABS: ORAL_TABLET | ORAL | 2 refills | Status: DC

## 2018-12-03 NOTE — Telephone Encounter (Signed)
Legrand Como (son) called requesting to speak with someone in regards to his mother and medication Torsemide  20 mg   [phone].

## 2018-12-03 NOTE — Telephone Encounter (Signed)
Need new rx sent to torsemide 40 mg in the morning and 20 mg in the evening sent to [facility]. Advised that a new rex would be sent today.

## 2019-01-21 ENCOUNTER — Other Ambulatory Visit: Payer: Self-pay | Admitting: Cardiovascular Disease

## 2019-01-21 MED ORDER — METOPROLOL TARTRATE 25 MG PO TABS: ORAL_TABLET | ORAL | 1 refills | Status: DC

## 2019-01-21 NOTE — Telephone Encounter (Signed)
° ° ° °  1. Which medications need to be refilled? (please list name of each medication and dose if known)  metoprolol tartrate (LOPRESSOR) 25 MG    2. Which pharmacy/location (including street and city if local pharmacy) is medication to be sent to? [facility] [place]  3. Do they need a 30 day or 90 day supply?

## 2019-07-01 ENCOUNTER — Other Ambulatory Visit: Payer: Self-pay

## 2019-07-01 MED ORDER — APIXABAN 5 MG PO TABS: 5.0000 mg | ORAL_TABLET | Freq: Two times a day (BID) | ORAL | 6 refills | Status: DC

## 2019-07-01 NOTE — Telephone Encounter (Signed)
ELIQUIS 5 MG TABS tablet Take 1 tablet by mouth twice daily 60 tablet   rec'd call from Pt's Son(Michael) Please in generic for eliquis to [facility] [phone]  Son's [phone]   Thanks renee

## 2019-07-01 NOTE — Telephone Encounter (Signed)
No generic available for eliquis, e-scribed refill to [facility]

## 2019-08-01 ENCOUNTER — Other Ambulatory Visit: Payer: Self-pay | Admitting: Cardiovascular Disease

## 2019-08-11 ENCOUNTER — Other Ambulatory Visit: Payer: Self-pay | Admitting: *Deleted

## 2019-08-11 MED ORDER — METOPROLOL TARTRATE 25 MG PO TABS: 12.5000 mg | ORAL_TABLET | Freq: Two times a day (BID) | ORAL | 0 refills | Status: DC

## 2019-08-24 NOTE — Progress Notes (Deleted)
Cardiology Office Note  Date: 08/24/2019   ID: Tracy [name], DOB October 16, 1926, MRN [id number]  PCP:  Patient, No Pcp Per  Cardiologist:  Kate Sable, MD Electrophysiologist:  None   Chief Complaint: F/ U Atrial fibrillation  History of Present Illness: Tracy [name] is a 86 y.o. female with a history of permanent atrial fibrillation and lower extremity edema.  Last saw Dr. Marcello Moores on 10/29/2018.  Her atrial fibrillation was symptomatically stable.  Heart rate was controlled on Lopressor 12.5 mg daily.  She was anticoagulated with Eliquis 5 mg p.o. twice daily.  She was experiencing bilateral leg edema.  A discussion took place regarding sodium restriction.  He switch her Lasix to torsemide and supplemented with potassium 20 mEq daily.  A BMP was ordered for follow-up of medication change.  Past Medical History:  Diagnosis Date  . A-fib ([place])   . Chronic kidney disease    hx kidney infection  . Hernia, abdominal   . Hypertension   . Shortness of breath     Past Surgical History:  Procedure Laterality Date  . APPENDECTOMY    . CATARACT EXTRACTION W/PHACO Right 08/28/2012   Procedure: CATARACT EXTRACTION PHACO AND INTRAOCULAR LENS PLACEMENT (IOC);  Surgeon: Marylynn Pearson, MD;  Location: [place];  Service: Ophthalmology;  Laterality: Right;  . HERNIA REPAIR     umbilical  . TUBAL LIGATION      Current Outpatient Medications  Medication Sig Dispense Refill  . apixaban (ELIQUIS) 5 MG TABS tablet Take 1 tablet (5 mg total) by mouth 2 (two) times daily. 60 tablet 6  . bimatoprost (LUMIGAN) 0.01 % SOLN Place 1 drop into both eyes at bedtime.    . metoprolol tartrate (LOPRESSOR) 25 MG tablet Take 0.5 tablets (12.5 mg total) by mouth 2 (two) times daily. 30 tablet 0  . potassium chloride SA (K-DUR) 20 MEQ tablet Take 1 tablet (20 mEq total) by mouth daily. 90 tablet 1  . torsemide (DEMADEX) 20 MG tablet TAKE 40 MG IN THE MORNING AND 20 MG IN THE EVENING 270 tablet 2   No  current facility-administered medications for this visit.   Allergies:  Penicillins and Tylenol with codeine #3 [acetaminophen-codeine]   Social History: The patient  reports that she has never smoked. She has never used smokeless tobacco. She reports that she does not drink alcohol or use drugs.   Family History: The patient's family history includes Cancer in her sister; Heart attack in her mother; Heart disease in her mother and sister; Heart failure in her brother; Stroke in her brother.   ROS:  Please see the history of present illness. Otherwise, complete review of systems is positive for {NONE DEFAULTED:18576::"none"}.  All other systems are reviewed and negative.   Physical Exam: VS:  There were no vitals taken for this visit., BMI There is no height or weight on file to calculate BMI.  Wt Readings from Last 3 Encounters:  10/29/18 130 lb (59 kg)  03/01/18 129 lb (58.5 kg)  11/22/16 143 lb (64.9 kg)    General: Patient appears comfortable at rest. HEENT: Conjunctiva and lids normal, oropharynx clear with moist mucosa. Neck: Supple, no elevated JVP or carotid bruits, no thyromegaly. Lungs: Clear to auscultation, nonlabored breathing at rest. Cardiac: Regular rate and rhythm, no S3 or significant systolic murmur, no pericardial rub. Abdomen: Soft, nontender, no hepatomegaly, bowel sounds present, no guarding or rebound. Extremities: No pitting edema, distal pulses 2+. Skin: Warm and dry. Musculoskeletal: No kyphosis.  Neuropsychiatric: Alert and oriented x3, affect grossly appropriate.  ECG:  {EKG/Telemetry Strips [phone]}  Recent Labwork: 11/11/2018: BUN 23; Creat 1.51; Potassium 4.0; Sodium 140     Component Value Date/Time   CHOL 193 10/15/2015 0605   TRIG 99 10/15/2015 0605   HDL 60 10/15/2015 0605   CHOLHDL 3.2 10/15/2015 0605   VLDL 20 10/15/2015 0605   LDLCALC 113 (H) 10/15/2015 0605    Other Studies Reviewed Today:  Echocardiogram on 03/20/2018  demonstrated vigorous left ventricular systolic function and normal regional wall motion, LVEF 65 to 70%.  She underwent a normal nuclear stress test on 03/20/2018.  Assessment and Plan:  1. Permanent atrial fibrillation (HCC)   2. Bilateral leg edema    1. Permanent atrial fibrillation (HCC) ***  2. Bilateral leg edema ***  Medication Adjustments/Labs and Tests Ordered: Current medicines are reviewed at length with the patient today.  Concerns regarding medicines are outlined above.   Disposition: Follow-up with   Signed, Rennis Harding, NP 08/24/2019 10:04 PM    [facility] at [facility] [address] [place], [place], [place] [address] Phone: [phone]; Fax: [phone]

## 2019-08-25 ENCOUNTER — Ambulatory Visit: Payer: Medicare HMO | Admitting: Family Medicine

## 2019-09-21 NOTE — Progress Notes (Signed)
Virtual Visit via Telephone Note   This visit type was conducted due to national recommendations for restrictions regarding the COVID-19 Pandemic (e.g. social distancing) in an effort to limit this patient's exposure and mitigate transmission in our community.  Due to her co-morbid illnesses, this patient is at least at moderate risk for complications without adequate follow up.  This format is felt to be most appropriate for this patient at this time.  The patient did not have access to video technology/had technical difficulties with video requiring transitioning to audio format only (telephone).  All issues noted in this document were discussed and addressed.  No physical exam could be performed with this format.  Please refer to the patient's chart for her  consent to telehealth for [facility].   The patient was identified using 2 identifiers.  Date:  09/22/2019   ID:  Tracy [name], DOB 1926/11/03, MRN [id number]  Patient Location: Home Provider Location: Office  PCP:  Patient, No Pcp Per [place] [facility] Cardiologist:  Prentice Docker, MD  Electrophysiologist:  None   Evaluation Performed:  Follow-Up Visit  Chief Complaint:  Atrial fib   History of Present Illness:    Tracy [name] is a 86 y.o. female with permanent atrial fibrillation. loperssor 12.5 BID and eliquis   Echocardiogram on 03/20/2018 demonstrated vigorous left ventricular systolic function and normal regional wall motion, LVEF 65 to 70%.  She underwent a normal nuclear stress test on 03/20/2018.  Today requested telephone visit due to freq bathroom trips. For diarrhea, for 2-3 days and 3 times per day.  She did not want to talk so her son gave me information.  She has not had COVID and sticks to home.  Has poor mobility needs assistance to ambulate  No racing HR or chest pain, no complaints of SOB.  Her appetite is decreased most days.  Her wt is down but mostly from fluid that has decreased.     No  blood in stools or urine. No syncope.   The patient does not have symptoms concerning for COVID-19 infection (fever, chills, cough, or new shortness of breath).    Past Medical History:  Diagnosis Date  . A-fib (HCC)   . Chronic kidney disease    hx kidney infection  . Hernia, abdominal   . Hypertension   . Shortness of breath    Past Surgical History:  Procedure Laterality Date  . APPENDECTOMY    . CATARACT EXTRACTION W/PHACO Right 08/28/2012   Procedure: CATARACT EXTRACTION PHACO AND INTRAOCULAR LENS PLACEMENT (IOC);  Surgeon: Chalmers Guest, MD;  Location: [facility] OR;  Service: Ophthalmology;  Laterality: Right;  . HERNIA REPAIR     umbilical  . TUBAL LIGATION       Current Meds  Medication Sig  . apixaban (ELIQUIS) 5 MG TABS tablet Take 1 tablet (5 mg total) by mouth 2 (two) times daily.  . metoprolol tartrate (LOPRESSOR) 25 MG tablet Take 0.5 tablets (12.5 mg total) by mouth 2 (two) times daily.  . potassium chloride SA (K-DUR) 20 MEQ tablet Take 1 tablet (20 mEq total) by mouth daily.  Marland Kitchen torsemide (DEMADEX) 20 MG tablet Take 40 mg by mouth 2 (two) times daily.     Allergies:   Penicillins and Tylenol with codeine #3 [acetaminophen-codeine]   Social History   Tobacco Use  . Smoking status: Never Smoker  . Smokeless tobacco: Never Used  Substance Use Topics  . Alcohol use: No    Alcohol/week: 0.0 standard drinks  .  Drug use: No     Family Hx: The patient's family history includes Cancer in her sister; Heart attack in her mother; Heart disease in her mother and sister; Heart failure in her brother; Stroke in her brother.  ROS:   Please see the history of present illness.    General:no colds or fevers, + weight loss Skin:no rashes or ulcers HEENT:no blurred vision, no congestion CV:see HPI PUL:see HPI GI:no diarrhea constipation or melena, no indigestion GU:no hematuria, no dysuria MS:no joint pain, no claudication Neuro:no syncope, no lightheadedness Endo:no  diabetes, no thyroid disease  All other systems reviewed and are negative.   Prior CV studies:   The following studies were reviewed today: [facility] 03/20/18 Study Conclusions   - Left ventricle: The cavity size was normal. Wall thickness was  normal. Systolic function was vigorous. The estimated ejection  fraction was in the range of 65% to 70%. Wall motion was normal;  there were no regional wall motion abnormalities. The study was  not technically sufficient to allow evaluation of LV diastolic  dysfunction due to atrial fibrillation.  - Aortic valve: Mildly calcified annulus. Trileaflet; mildly  calcified leaflets.  - Mitral valve: Systolic bowing without prolapse.  - Left atrium: The atrium was mildly dilated.  - Right atrium: Central venous pressure (est): 3 mm Hg.  - Atrial septum: No defect or patent foramen ovale was identified.  - Tricuspid valve: There was trivial regurgitation.  - Pulmonary arteries: PA peak pressure: 40 mm Hg (S).  - Pericardium, extracardiac: A prominent pericardial fat pad was  present.    Labs/Other Tests and Data Reviewed:    EKG:  An ECG dated 2019  was personally reviewed today and demonstrated:  A fib with rate 70, and no ST changes. 03/01/18   Recent Labs: 11/11/2018: BUN 23; Creat 1.51; Potassium 4.0; Sodium 140   Recent Lipid Panel Lab Results  Component Value Date/Time   CHOL 193 10/15/2015 06:05 AM   TRIG 99 10/15/2015 06:05 AM   HDL 60 10/15/2015 06:05 AM   CHOLHDL 3.2 10/15/2015 06:05 AM   LDLCALC 113 (H) 10/15/2015 06:05 AM    Wt Readings from Last 3 Encounters:  09/22/19 117 lb (53.1 kg)  10/29/18 130 lb (59 kg)  03/01/18 129 lb (58.5 kg)     Objective:    Vital Signs:  Ht 5\' 1"  (1.549 m)   Wt 117 lb (53.1 kg)   BMI 22.11 kg/m    VITAL SIGNS:  reviewed No VS - pt would not allow  ASSESSMENT & PLAN:    1. Permanent atrial fib, on eliquis with no bleeding, pt refused BP and family unable to do pulse  check.  I have requested they come to the office for EKG in 1 month. 2. Diarrhea, if does not improve will call [facility] office [place]. Her PCP is on leave.   3. Weight loss they will monitor, if continues to see PCP - her prior edema is resolved but I have asked family to weigh twice a week.  Follow up in 6 months in the office   COVID-19 Education: The signs and symptoms of COVID-19 were discussed with the patient and how to seek care for testing (follow up with PCP or arrange E-visit).  The importance of social distancing was discussed today.  Time:   Today, I have spent 8 minutes with the patient with telehealth technology discussing the above problems.     Medication Adjustments/Labs and Tests Ordered: Current medicines are reviewed  at length with the patient today.  Concerns regarding medicines are outlined above.   Tests Ordered: No orders of the defined types were placed in this encounter.   Medication Changes: No orders of the defined types were placed in this encounter.   Follow Up:  In Person in 6 month(s)  Signed, Cecilie Kicks, NP  09/22/2019 1:08 PM    [facility]

## 2019-09-22 ENCOUNTER — Telehealth: Payer: Self-pay

## 2019-09-22 ENCOUNTER — Other Ambulatory Visit: Payer: Self-pay

## 2019-09-22 ENCOUNTER — Telehealth (INDEPENDENT_AMBULATORY_CARE_PROVIDER_SITE_OTHER): Payer: Medicare HMO | Admitting: Cardiology

## 2019-09-22 ENCOUNTER — Encounter: Payer: Self-pay | Admitting: Cardiology

## 2019-09-22 VITALS — Ht 61.0 in | Wt 117.0 lb

## 2019-09-22 DIAGNOSIS — R197 Diarrhea, unspecified: Secondary | ICD-10-CM | POA: Diagnosis not present

## 2019-09-22 DIAGNOSIS — R634 Abnormal weight loss: Secondary | ICD-10-CM | POA: Diagnosis not present

## 2019-09-22 DIAGNOSIS — I4821 Permanent atrial fibrillation: Secondary | ICD-10-CM | POA: Diagnosis not present

## 2019-09-22 MED ORDER — APIXABAN 5 MG PO TABS: 5.0000 mg | ORAL_TABLET | Freq: Two times a day (BID) | ORAL | 0 refills | Status: DC

## 2019-09-22 MED ORDER — METOPROLOL TARTRATE 25 MG PO TABS: 12.5000 mg | ORAL_TABLET | Freq: Two times a day (BID) | ORAL | 0 refills | Status: DC

## 2019-09-22 NOTE — Patient Instructions (Signed)
Medication Instructions:  Your physician recommends that you continue on your current medications as directed. Please refer to the Current Medication list given to you today.  *If you need a refill on your cardiac medications before your next appointment, please call your pharmacy*   Lab Work: None If you have labs (blood work) drawn today and your tests are completely normal, you will receive your results only by: Marland Kitchen MyChart Message (if you have MyChart) OR . A paper copy in the mail If you have any lab test that is abnormal or we need to change your treatment, we will call you to review the results.   Testing/Procedures: None   Follow-Up: At [facility], you and your health needs are our priority.  As part of our continuing mission to provide you with exceptional heart care, we have created designated Provider Care Teams.  These Care Teams include your primary Cardiologist (physician) and Advanced Practice Providers (APPs -  Physician Assistants and Nurse Practitioners) who all work together to provide you with the care you need, when you need it.  We recommend signing up for the patient portal called "MyChart".  Sign up information is provided on this After Visit Summary.  MyChart is used to connect with patients for Virtual Visits (Telemedicine).  Patients are able to view lab/test results, encounter notes, upcoming appointments, etc.  Non-urgent messages can be sent to your provider as well.   To learn more about what you can do with MyChart, go to [email or web address].    Your next appointment:   6 month(s)  The format for your next appointment:   In Person  Provider:   You may see Prentice Docker, MD or one of the following Advanced Practice Providers on your designated Care Team:    Randall An, PA-C   Jacolyn Reedy, PA-C     Other Instructions Check your weight twice a week and call PCP if weight continues to decrease.

## 2019-09-22 NOTE — Addendum Note (Signed)
Addended by: Cleda Mccreedy on: 09/22/2019 01:47 PM   Modules accepted: Orders

## 2019-09-22 NOTE — Telephone Encounter (Signed)
°  Patient Consent for Virtual Visit         Tracy [name] has provided verbal consent on 09/22/2019 for a virtual visit (video or telephone).   CONSENT FOR VIRTUAL VISIT FOR:  Tracy [name]  By participating in this virtual visit I agree to the following:  I hereby voluntarily request, consent and authorize [facility] and its employed or contracted physicians, physician assistants, nurse practitioners or other licensed health care professionals (the Practitioner), to provide me with telemedicine health care services (the Services") as deemed necessary by the treating Practitioner. I acknowledge and consent to receive the Services by the Practitioner via telemedicine. I understand that the telemedicine visit will involve communicating with the Practitioner through live audiovisual communication technology and the disclosure of certain medical information by electronic transmission. I acknowledge that I have been given the opportunity to request an in-person assessment or other available alternative prior to the telemedicine visit and am voluntarily participating in the telemedicine visit.  I understand that I have the right to withhold or withdraw my consent to the use of telemedicine in the course of my care at any time, without affecting my right to future care or treatment, and that the Practitioner or I may terminate the telemedicine visit at any time. I understand that I have the right to inspect all information obtained and/or recorded in the course of the telemedicine visit and may receive copies of available information for a reasonable fee.  I understand that some of the potential risks of receiving the Services via telemedicine include:   Delay or interruption in medical evaluation due to technological equipment failure or disruption;  Information transmitted may not be sufficient (e.g. poor resolution of images) to allow for appropriate medical decision making by the  Practitioner; and/or   In rare instances, security protocols could fail, causing a breach of personal health information.  Furthermore, I acknowledge that it is my responsibility to provide information about my medical history, conditions and care that is complete and accurate to the best of my ability. I acknowledge that Practitioner's advice, recommendations, and/or decision may be based on factors not within their control, such as incomplete or inaccurate data provided by me or distortions of diagnostic images or specimens that may result from electronic transmissions. I understand that the practice of medicine is not an exact science and that Practitioner makes no warranties or guarantees regarding treatment outcomes. I acknowledge that a copy of this consent can be made available to me via my patient portal [facility] MyChart), or I can request a printed copy by calling the office of [facility].    I understand that my insurance will be billed for this visit.   I have read or had this consent read to me.  I understand the contents of this consent, which adequately explains the benefits and risks of the Services being provided via telemedicine.   I have been provided ample opportunity to ask questions regarding this consent and the Services and have had my questions answered to my satisfaction.  I give my informed consent for the services to be provided through the use of telemedicine in my medical care

## 2019-10-23 ENCOUNTER — Ambulatory Visit: Payer: Medicare HMO

## 2019-10-27 ENCOUNTER — Other Ambulatory Visit: Payer: Self-pay

## 2019-10-27 ENCOUNTER — Ambulatory Visit (INDEPENDENT_AMBULATORY_CARE_PROVIDER_SITE_OTHER): Payer: Medicare HMO | Admitting: *Deleted

## 2019-10-27 VITALS — BP 128/64 | HR 56

## 2019-10-27 DIAGNOSIS — I4821 Permanent atrial fibrillation: Secondary | ICD-10-CM

## 2019-10-29 ENCOUNTER — Telehealth: Payer: Self-pay

## 2019-10-29 NOTE — Telephone Encounter (Signed)
-----   Message from Leone Brand, NP sent at 10/28/2019  3:40 PM EDT ----- That is ok if doesn't last long, and not with walking or sitting in the chair.   ----- Message ----- From: Kerney Elbe, LPN Sent: [id number]/64/3838   1:12 PM EDT To: Leone Brand, NP  Spoke with son who states that pt c/o dizziness when she bends over.

## 2019-10-29 NOTE — Telephone Encounter (Signed)
Left message on sons phone with Rhonda's advice.Asked hi to call back if he had any questions.

## 2019-12-24 ENCOUNTER — Other Ambulatory Visit: Payer: Self-pay | Admitting: Family Medicine

## 2020-01-07 ENCOUNTER — Telehealth: Payer: Self-pay | Admitting: Student

## 2020-01-07 ENCOUNTER — Encounter: Payer: Self-pay | Admitting: *Deleted

## 2020-01-07 MED ORDER — TORSEMIDE 20 MG PO TABS: 40.0000 mg | ORAL_TABLET | Freq: Two times a day (BID) | ORAL | 0 refills | Status: DC

## 2020-01-07 NOTE — Telephone Encounter (Signed)
Lab work requested from PCP. 

## 2020-01-07 NOTE — Telephone Encounter (Signed)
New message      *STAT* If patient is at the pharmacy, call can be transferred to refill team.   1. Which medications need to be refilled? (please list name of each medication and dose if known) torsemide (DEMADEX) 20 MG tablet  2. Which pharmacy/location (including street and city if local pharmacy) is medication to be sent to? [facility] [place]   3. Do they need a 30 day or 90 day supply? 90

## 2020-01-22 ENCOUNTER — Ambulatory Visit: Payer: Medicare HMO | Admitting: Student

## 2020-01-22 NOTE — Progress Notes (Deleted)
Cardiology Office Note    Date:  01/22/2020   ID:  Tracy [name], DOB 1926/10/14, MRN [id number]  PCP:  Koren Shiver, DO  Cardiologist: No primary care provider on file.    No chief complaint on file.   History of Present Illness:    Tracy [name] is a 86 y.o. female with past medical history of permanent atrial fibrillation, lower extremity edema and hypertension who presents to the office today for 58-month follow-up.  She most recently had a telehealth visit with Nada Boozer, NP in 09/2019 and denied any recent chest pain or palpitations at that time. She had been suffering from diarrhea for several days but denied any melena or hematochezia. She was continued on her current medication regimen and informed to follow-up with her PCP if her diarrhea did not improve. It was arranged for her to come to the office for a nurse visit for an EKG and this showed atrial fibrillation with slow ventricular response, heart rate 56 with no acute ST abnormalities.    Past Medical History:  Diagnosis Date  . A-fib (HCC)   . Chronic kidney disease    hx kidney infection  . Hernia, abdominal   . Hypertension   . Shortness of breath     Past Surgical History:  Procedure Laterality Date  . APPENDECTOMY    . CATARACT EXTRACTION W/PHACO Right 08/28/2012   Procedure: CATARACT EXTRACTION PHACO AND INTRAOCULAR LENS PLACEMENT (IOC);  Surgeon: Chalmers Guest, MD;  [facility] OR;  Service: Ophthalmology;  Laterality: Right;  . HERNIA REPAIR     umbilical  . TUBAL LIGATION      Current Medications: Outpatient Medications Prior to Visit  Medication Sig Dispense Refill  . apixaban (ELIQUIS) 5 MG TABS tablet Take 1 tablet (5 mg total) by mouth 2 (two) times daily. 180 tablet 0  . metoprolol tartrate (LOPRESSOR) 25 MG tablet Take 1/2 (one-half) tablet by mouth twice daily 90 tablet 1  . potassium chloride SA (K-DUR) 20 MEQ tablet Take 1 tablet (20 mEq total) by mouth daily. 90 tablet 1    . torsemide (DEMADEX) 20 MG tablet Take 2 tablets (40 mg total) by mouth 2 (two) times daily. 180 tablet 0   No facility-administered medications prior to visit.     Allergies:   Penicillins and Tylenol with codeine #3 [acetaminophen-codeine]   Social History   Socioeconomic History  . Marital status: Widowed    Spouse name: Not on file  . Number of children: Not on file  . Years of education: Not on file  . Highest education level: Not on file  Occupational History  . Not on file  Tobacco Use  . Smoking status: Never Smoker  . Smokeless tobacco: Never Used  Vaping Use  . Vaping Use: Never used  Substance and Sexual Activity  . Alcohol use: No    Alcohol/week: 0.0 standard drinks  . Drug use: No  . Sexual activity: Never  Other Topics Concern  . Not on file  Social History Narrative  . Not on file   Social Determinants of Health   Financial Resource Strain:   . Difficulty of Paying Living Expenses: Not on file  Food Insecurity:   . Worried About Programme researcher, broadcasting/film/video in the Last Year: Not on file  . Ran Out of Food in the Last Year: Not on file  Transportation Needs:   . Lack of Transportation (Medical): Not on file  . Lack of Transportation (Non-Medical):  Not on file  Physical Activity:   . Days of Exercise per Week: Not on file  . Minutes of Exercise per Session: Not on file  Stress:   . Feeling of Stress : Not on file  Social Connections:   . Frequency of Communication with Friends and Family: Not on file  . Frequency of Social Gatherings with Friends and Family: Not on file  . Attends Religious Services: Not on file  . Active Member of Clubs or Organizations: Not on file  . Attends Banker Meetings: Not on file  . Marital Status: Not on file     Family History:  The patient's ***family history includes Cancer in her sister; Heart attack in her mother; Heart disease in her mother and sister; Heart failure in her brother; Stroke in her brother.    Review of Systems:   Please see the history of present illness.     General:  No chills, fever, night sweats or weight changes.  Cardiovascular:  No chest pain, dyspnea on exertion, edema, orthopnea, palpitations, paroxysmal nocturnal dyspnea. Dermatological: No rash, lesions/masses Respiratory: No cough, dyspnea Urologic: No hematuria, dysuria Abdominal:   No nausea, vomiting, diarrhea, bright red blood per rectum, melena, or hematemesis Neurologic:  No visual changes, wkns, changes in mental status. All other systems reviewed and are otherwise negative except as noted above.   Physical Exam:    VS:  There were no vitals taken for this visit.   General: Well developed, well nourished,female appearing in no acute distress. Head: Normocephalic, atraumatic. Neck: No carotid bruits. JVD not elevated.  Lungs: Respirations regular and unlabored, without wheezes or rales.  Heart: ***Regular rate and rhythm. No S3 or S4.  No murmur, no rubs, or gallops appreciated. Abdomen: Appears non-distended. No obvious abdominal masses. Msk:  Strength and tone appear normal for age. No obvious joint deformities or effusions. Extremities: No clubbing or cyanosis. No edema.  Distal pedal pulses are 2+ bilaterally. Neuro: Alert and oriented X 3. Moves all extremities spontaneously. No focal deficits noted. Psych:  Responds to questions appropriately with a normal affect. Skin: No rashes or lesions noted  Wt Readings from Last 3 Encounters:  09/22/19 117 lb (53.1 kg)  10/29/18 130 lb (59 kg)  03/01/18 129 lb (58.5 kg)        Studies/Labs Reviewed:   EKG:  EKG is not ordered today. EKG from 10/27/2019 is reviewed an shows atrial fibrillation with slow ventricular response, heart rate 56 with no acute ST abnormalities.  Recent Labs: No results found for requested labs within last 8760 hours.   Lipid Panel    Component Value Date/Time   CHOL 193 10/15/2015 0605   TRIG 99 10/15/2015 0605    HDL 60 10/15/2015 0605   CHOLHDL 3.2 10/15/2015 0605   VLDL 20 10/15/2015 0605   LDLCALC 113 (H) 10/15/2015 0605    Additional studies/ records that were reviewed today include:   NST: 03/2018  No diagnostic ST segment changes to indicate ischemia.  No significant myocardial perfusion defects to indicate scar or ischemia.  This is a low risk study.  Nuclear stress EF: 69%.  Echocardiogram: 03/2018 Study Conclusions   - Left ventricle: The cavity size was normal. Wall thickness was  normal. Systolic function was vigorous. The estimated ejection  fraction was in the range of 65% to 70%. Wall motion was normal;  there were no regional wall motion abnormalities. The study was  not technically sufficient to allow evaluation of LV  diastolic  dysfunction due to atrial fibrillation.  - Aortic valve: Mildly calcified annulus. Trileaflet; mildly  calcified leaflets.  - Mitral valve: Systolic bowing without prolapse.  - Left atrium: The atrium was mildly dilated.  - Right atrium: Central venous pressure (est): 3 mm Hg.  - Atrial septum: No defect or patent foramen ovale was identified.  - Tricuspid valve: There was trivial regurgitation.  - Pulmonary arteries: PA peak pressure: 40 mm Hg (S).  - Pericardium, extracardiac: A prominent pericardial fat pad was  present.   Assessment:    No diagnosis found.   Plan:   In order of problems listed above:  1. ***    Medication Adjustments/Labs and Tests Ordered: Current medicines are reviewed at length with the patient today.  Concerns regarding medicines are outlined above.  Medication changes, Labs and Tests ordered today are listed in the Patient Instructions below. There are no Patient Instructions on file for this visit.   Signed, Ellsworth Lennox, PA-C  01/22/2020 10:19 AM    [facility] [address] [place], [place] [address] Phone: [phone] Fax: [phone]

## 2020-02-09 ENCOUNTER — Other Ambulatory Visit: Payer: Self-pay

## 2020-02-09 MED ORDER — APIXABAN 5 MG PO TABS: 5.0000 mg | ORAL_TABLET | Freq: Two times a day (BID) | ORAL | 3 refills | Status: DC

## 2020-02-09 NOTE — Telephone Encounter (Signed)
Refilled eliquis 5 mg bid as requested

## 2020-03-19 ENCOUNTER — Ambulatory Visit: Payer: Medicare HMO | Admitting: Cardiovascular Disease

## 2020-06-17 ENCOUNTER — Telehealth: Payer: Self-pay | Admitting: Student

## 2020-06-17 MED ORDER — APIXABAN 5 MG PO TABS: 5.0000 mg | ORAL_TABLET | Freq: Two times a day (BID) | ORAL | 3 refills | Status: DC

## 2020-06-17 MED ORDER — METOPROLOL TARTRATE 25 MG PO TABS: ORAL_TABLET | ORAL | 1 refills | Status: DC

## 2020-06-17 NOTE — Telephone Encounter (Signed)
Medication refill request approved and sent to [facility] pharmacy.

## 2020-06-17 NOTE — Telephone Encounter (Signed)
   1. Which medications need to be refilled? (please list name of each medication and dose if known)   1.eliquis 2.Lopressor 25 mg   2. Which pharmacy/location (including street and city if local pharmacy) is medication to be sent to?  [facility]   [place], [place]  3. Do they need a 30 day or 90 day supply?

## 2020-07-16 ENCOUNTER — Ambulatory Visit: Payer: Medicare HMO | Admitting: Student

## 2020-12-30 ENCOUNTER — Other Ambulatory Visit: Payer: Self-pay | Admitting: Student

## 2020-12-30 NOTE — Telephone Encounter (Signed)
This is a B and E pt.  °

## 2021-01-01 ENCOUNTER — Other Ambulatory Visit: Payer: Self-pay | Admitting: Student

## 2021-01-03 ENCOUNTER — Other Ambulatory Visit: Payer: Self-pay | Admitting: Student

## 2021-01-17 ENCOUNTER — Ambulatory Visit: Payer: Medicare HMO | Admitting: Cardiology

## 2021-02-04 ENCOUNTER — Other Ambulatory Visit: Payer: Self-pay | Admitting: Student

## 2021-02-04 NOTE — Telephone Encounter (Addendum)
Prescription refill request for Eliquis received. Indication:Afib  Last office visit: 09/22/19 Annie Paras)  Scr: 1.32 (05/26/19) Age: 87 Weight: 53.1kg  Pt overdue for office visit and labs. Confirmed with PCP that labs are overdue. Called and spoke with pt's son. He stated pt has memory and mobility issues and it is very difficult for pt to come into the office. He asked if there was anyway labs and office visit could be completed as the same time. Appt scheduled for 03/07/21 with Dr Mayford Knife. Labs can be completed at office visit. Pt requires dose decrease to 2.5mg  per Efraim Kaufmann, PharmD due to age and weight. Order changed and called pt's son to make aware of dose change. Verbalized understanding.

## 2021-03-07 ENCOUNTER — Ambulatory Visit: Payer: Medicare HMO | Admitting: Cardiology

## 2021-03-07 ENCOUNTER — Other Ambulatory Visit: Payer: Self-pay

## 2021-03-07 MED ORDER — APIXABAN 2.5 MG PO TABS: 2.5000 mg | ORAL_TABLET | Freq: Two times a day (BID) | ORAL | 1 refills | Status: DC

## 2021-03-17 NOTE — Progress Notes (Deleted)
Cardiology Office Note:   Date:  03/17/2021  NAME:  Tracy [name]    MRN: [id number] DOB:  [birth year]   PCP:  Koren Shiver, DO  Cardiologist:  None  Electrophysiologist:  None   Referring MD: Koren Shiver, DO   No chief complaint on file. ***  History of Present Illness:   Tracy [name] is a 87 y.o. female with a hx of perm afib, CKD who presents for follow-up of Afib.   Problem List Permanent Afib  -CHADSVASC=4 (age, female, HTN) HTN  Past Medical History: Past Medical History:  Diagnosis Date   A-fib (HCC)    Chronic kidney disease    hx kidney infection   Hernia, abdominal    Hypertension    Shortness of breath     Past Surgical History: Past Surgical History:  Procedure Laterality Date   APPENDECTOMY     CATARACT EXTRACTION W/PHACO Right 08/28/2012   Procedure: CATARACT EXTRACTION PHACO AND INTRAOCULAR LENS PLACEMENT (IOC);  Surgeon: Chalmers Guest, MD;  Location: [facility] OR;  Service: Ophthalmology;  Laterality: Right;   HERNIA REPAIR     umbilical   TUBAL LIGATION      Current Medications: No outpatient medications have been marked as taking for the 03/18/21 encounter (Appointment) with O'Neal, Ronnald Ramp, MD.     Allergies:    Penicillins and Tylenol with codeine #3 [acetaminophen-codeine]   Social History: Social History   Socioeconomic History   Marital status: Widowed    Spouse name: Not on file   Number of children: Not on file   Years of education: Not on file   Highest education level: Not on file  Occupational History   Not on file  Tobacco Use   Smoking status: Never   Smokeless tobacco: Never  Vaping Use   Vaping Use: Never used  Substance and Sexual Activity   Alcohol use: No    Alcohol/week: 0.0 standard drinks   Drug use: No   Sexual activity: Never  Other Topics Concern   Not on file  Social History Narrative   Not on file   Social Determinants of Health   Financial Resource Strain: Not on file  Food  Insecurity: Not on file  Transportation Needs: Not on file  Physical Activity: Not on file  Stress: Not on file  Social Connections: Not on file     Family History: The patient's ***family history includes Cancer in her sister; Heart attack in her mother; Heart disease in her mother and sister; Heart failure in her brother; Stroke in her brother.  ROS:   All other ROS reviewed and negative. Pertinent positives noted in the HPI.     EKGs/Labs/Other Studies Reviewed:   The following studies were personally reviewed by me today:  EKG:  EKG is *** ordered today.  The ekg ordered today demonstrates ***, and was personally reviewed by me.   TTE 03/20/2018 - Left ventricle: The cavity size was normal. Wall thickness was    normal. Systolic function was vigorous. The estimated ejection    fraction was in the range of 65% to 70%. Wall motion was normal;    there were no regional wall motion abnormalities. The study was    not technically sufficient to allow evaluation of LV diastolic    dysfunction due to atrial fibrillation.  - Aortic valve: Mildly calcified annulus. Trileaflet; mildly    calcified leaflets.  - Mitral valve: Systolic bowing without prolapse.  - Left atrium: The atrium was  mildly dilated.  - Right atrium: Central venous pressure (est): 3 mm Hg.  - Atrial septum: No defect or patent foramen ovale was identified.  - Tricuspid valve: There was trivial regurgitation.  - Pulmonary arteries: PA peak pressure: 40 mm Hg (S).  - Pericardium, extracardiac: A prominent pericardial fat pad was    present.   NM Stress 03/20/2018 No diagnostic ST segment changes to indicate ischemia. No significant myocardial perfusion defects to indicate scar or ischemia. This is a low risk study. Nuclear stress EF: 69%.    Recent Labs: No results found for requested labs within last 8760 hours.   Recent Lipid Panel    Component Value Date/Time   CHOL 193 10/15/2015 0605   TRIG 99  10/15/2015 0605   HDL 60 10/15/2015 0605   CHOLHDL 3.2 10/15/2015 0605   VLDL 20 10/15/2015 0605   LDLCALC 113 (H) 10/15/2015 0605    Physical Exam:   VS:  There were no vitals taken for this visit.   Wt Readings from Last 3 Encounters:  09/22/19 117 lb (53.1 kg)  10/29/18 130 lb (59 kg)  03/01/18 129 lb (58.5 kg)    General: Well nourished, well developed, in no acute distress Head: Atraumatic, normal size  Eyes: PEERLA, EOMI  Neck: Supple, no JVD Endocrine: No thryomegaly Cardiac: Normal S1, S2; RRR; no murmurs, rubs, or gallops Lungs: Clear to auscultation bilaterally, no wheezing, rhonchi or rales  Abd: Soft, nontender, no hepatomegaly  Ext: No edema, pulses 2+ Musculoskeletal: No deformities, BUE and BLE strength normal and equal Skin: Warm and dry, no rashes   Neuro: Alert and oriented to person, place, time, and situation, CNII-XII grossly intact, no focal deficits  Psych: Normal mood and affect   ASSESSMENT:   Tracy [name] is a 87 y.o. female who presents for the following: No diagnosis found.  PLAN:   There are no diagnoses linked to this encounter.  {Are you ordering a CV Procedure (e.g. stress test, cath, DCCV, TEE, etc)?   Press F2        :[id number]}  Disposition: No follow-ups on file.  Medication Adjustments/Labs and Tests Ordered: Current medicines are reviewed at length with the patient today.  Concerns regarding medicines are outlined above.  No orders of the defined types were placed in this encounter.  No orders of the defined types were placed in this encounter.   There are no Patient Instructions on file for this visit.   Time Spent with Patient: I have spent a total of *** minutes with patient reviewing hospital notes, telemetry, EKGs, labs and examining the patient as well as establishing an assessment and plan that was discussed with the patient.  > 50% of time was spent in direct patient care.  Signed, Lenna Gilford. Flora Lipps, MD, [facility]  [facility]  [address], [address] [place], [place] [address] [phone]  03/17/2021 6:42 PM

## 2021-03-18 ENCOUNTER — Other Ambulatory Visit: Payer: Self-pay

## 2021-03-18 ENCOUNTER — Telehealth: Payer: Self-pay | Admitting: Cardiology

## 2021-03-18 ENCOUNTER — Ambulatory Visit: Payer: Medicare HMO | Admitting: Cardiovascular Disease

## 2021-03-18 DIAGNOSIS — I4821 Permanent atrial fibrillation: Secondary | ICD-10-CM

## 2021-03-18 DIAGNOSIS — R600 Localized edema: Secondary | ICD-10-CM

## 2021-03-18 MED ORDER — APIXABAN 2.5 MG PO TABS
2.5000 mg | ORAL_TABLET | Freq: Two times a day (BID) | ORAL | 1 refills | Status: DC
Start: 2021-03-18 — End: 2021-05-16

## 2021-03-18 MED ORDER — METOPROLOL TARTRATE 25 MG PO TABS: ORAL_TABLET | ORAL | 0 refills | Status: DC

## 2021-03-18 NOTE — Telephone Encounter (Signed)
Prescription refill request for Eliquis received. Indication: afib  Last office visit: Tracy [name], 09/22/2019 Scr: 1.3, 05/2019 Age: 87 yo  Weight: 53.1 kg   Pt is overdue for blood work and for an office visit. Pt is scheduled to see Dr. Scharlene Gloss on 05/16/2021. Spoke to pt's son will give pt just a 1 month supply and that should be able to get her close to her appt with Dr. Flora Lipps.

## 2021-03-18 NOTE — Telephone Encounter (Signed)
*  STAT* If patient is at the pharmacy, call can be transferred to refill team.   1. Which medications need to be refilled? (please list name of each medication and dose if known) apixaban (ELIQUIS) 2.5 MG TABS tablet metoprolol tartrate (LOPRESSOR) 25 MG tablet 2. Which pharmacy/location (including street and city if local pharmacy) is medication to be sent to? [facility] [address], [place] - [address]  3. Do they need a 30 day or 90 day supply? 90 day supply

## 2021-03-18 NOTE — Telephone Encounter (Signed)
Refilled Lopressor 25 mg, 1/2 tablet twice daily #90 RF:0  Pt rescheduled apt from today for 05/16/2021

## 2021-03-18 NOTE — Telephone Encounter (Signed)
This is a [place] pt.  °

## 2021-05-15 NOTE — Progress Notes (Deleted)
Cardiology Office Note:   Date:  05/15/2021  NAME:  Tracy [name]    MRN: [id number] DOB:  [birth year]   PCP:  Koren Shiver, DO  Cardiologist:  None  Electrophysiologist:  None   Referring MD: Koren Shiver, DO   No chief complaint on file. ***  History of Present Illness:   Tracy [name] is a 88 y.o. female with a hx of permanent Afib, HTN who presents for follow-up.   Problem List Permanent afib HTN  Past Medical History: Past Medical History:  Diagnosis Date   A-fib (HCC)    Chronic kidney disease    hx kidney infection   Hernia, abdominal    Hypertension    Shortness of breath     Past Surgical History: Past Surgical History:  Procedure Laterality Date   APPENDECTOMY     CATARACT EXTRACTION W/PHACO Right 08/28/2012   Procedure: CATARACT EXTRACTION PHACO AND INTRAOCULAR LENS PLACEMENT (IOC);  Surgeon: Chalmers Guest, MD;  Location: [facility] OR;  Service: Ophthalmology;  Laterality: Right;   HERNIA REPAIR     umbilical   TUBAL LIGATION      Current Medications: No outpatient medications have been marked as taking for the 05/16/21 encounter (Appointment) with O'Neal, Ronnald Ramp, MD.     Allergies:    Penicillins and Tylenol with codeine #3 [acetaminophen-codeine]   Social History: Social History   Socioeconomic History   Marital status: Widowed    Spouse name: Not on file   Number of children: Not on file   Years of education: Not on file   Highest education level: Not on file  Occupational History   Not on file  Tobacco Use   Smoking status: Never   Smokeless tobacco: Never  Vaping Use   Vaping Use: Never used  Substance and Sexual Activity   Alcohol use: No    Alcohol/week: 0.0 standard drinks   Drug use: No   Sexual activity: Never  Other Topics Concern   Not on file  Social History Narrative   Not on file   Social Determinants of Health   Financial Resource Strain: Not on file  Food Insecurity: Not on file  Transportation  Needs: Not on file  Physical Activity: Not on file  Stress: Not on file  Social Connections: Not on file     Family History: The patient's ***family history includes Cancer in her sister; Heart attack in her mother; Heart disease in her mother and sister; Heart failure in her brother; Stroke in her brother.  ROS:   All other ROS reviewed and negative. Pertinent positives noted in the HPI.     EKGs/Labs/Other Studies Reviewed:   The following studies were personally reviewed by me today:  EKG:  EKG is *** ordered today.  The ekg ordered today demonstrates ***, and was personally reviewed by me.   MPI 03/20/2018   No diagnostic ST segment changes to indicate ischemia. No significant myocardial perfusion defects to indicate scar or ischemia. This is a low risk study. Nuclear stress EF: 69%.    TTE 03/20/2018 - Left ventricle: The cavity size was normal. Wall thickness was    normal. Systolic function was vigorous. The estimated ejection    fraction was in the range of 65% to 70%. Wall motion was normal;    there were no regional wall motion abnormalities. The study was    not technically sufficient to allow evaluation of LV diastolic    dysfunction due to atrial fibrillation.  -  Aortic valve: Mildly calcified annulus. Trileaflet; mildly    calcified leaflets.  - Mitral valve: Systolic bowing without prolapse.  - Left atrium: The atrium was mildly dilated.  - Right atrium: Central venous pressure (est): 3 mm Hg.  - Atrial septum: No defect or patent foramen ovale was identified.  - Tricuspid valve: There was trivial regurgitation.  - Pulmonary arteries: PA peak pressure: 40 mm Hg (S).  - Pericardium, extracardiac: A prominent pericardial fat pad was    present.   Recent Labs: No results found for requested labs within last 8760 hours.   Recent Lipid Panel    Component Value Date/Time   CHOL 193 10/15/2015 0605   TRIG 99 10/15/2015 0605   HDL 60 10/15/2015 0605   CHOLHDL  3.2 10/15/2015 0605   VLDL 20 10/15/2015 0605   LDLCALC 113 (H) 10/15/2015 0605    Physical Exam:   VS:  There were no vitals taken for this visit.   Wt Readings from Last 3 Encounters:  09/22/19 117 lb (53.1 kg)  10/29/18 130 lb (59 kg)  03/01/18 129 lb (58.5 kg)    General: Well nourished, well developed, in no acute distress Head: Atraumatic, normal size  Eyes: PEERLA, EOMI  Neck: Supple, no JVD Endocrine: No thryomegaly Cardiac: Normal S1, S2; RRR; no murmurs, rubs, or gallops Lungs: Clear to auscultation bilaterally, no wheezing, rhonchi or rales  Abd: Soft, nontender, no hepatomegaly  Ext: No edema, pulses 2+ Musculoskeletal: No deformities, BUE and BLE strength normal and equal Skin: Warm and dry, no rashes   Neuro: Alert and oriented to person, place, time, and situation, CNII-XII grossly intact, no focal deficits  Psych: Normal mood and affect   ASSESSMENT:   Tracy [name] is a 88 y.o. female who presents for the following: No diagnosis found.  PLAN:   There are no diagnoses linked to this encounter.  {Are you ordering a CV Procedure (e.g. stress test, cath, DCCV, TEE, etc)?   Press F2        :[id number]}  Disposition: No follow-ups on file.  Medication Adjustments/Labs and Tests Ordered: Current medicines are reviewed at length with the patient today.  Concerns regarding medicines are outlined above.  No orders of the defined types were placed in this encounter.  No orders of the defined types were placed in this encounter.   There are no Patient Instructions on file for this visit.   Time Spent with Patient: I have spent a total of *** minutes with patient reviewing hospital notes, telemetry, EKGs, labs and examining the patient as well as establishing an assessment and plan that was discussed with the patient.  > 50% of time was spent in direct patient care.  Signed, Lenna Gilford. Flora Lipps, MD, [facility]   [facility]  [address], [address] [place], [place] [address] [phone]  05/15/2021 5:18 PM

## 2021-05-16 ENCOUNTER — Telehealth: Payer: Self-pay | Admitting: Cardiovascular Disease

## 2021-05-16 ENCOUNTER — Other Ambulatory Visit: Payer: Self-pay

## 2021-05-16 ENCOUNTER — Ambulatory Visit: Payer: Medicare HMO | Admitting: Cardiovascular Disease

## 2021-05-16 DIAGNOSIS — I4821 Permanent atrial fibrillation: Secondary | ICD-10-CM

## 2021-05-16 DIAGNOSIS — I10 Essential (primary) hypertension: Secondary | ICD-10-CM

## 2021-05-16 MED ORDER — APIXABAN 2.5 MG PO TABS: 2.5000 mg | ORAL_TABLET | Freq: Two times a day (BID) | ORAL | 1 refills | Status: AC

## 2021-05-16 MED ORDER — METOPROLOL TARTRATE 25 MG PO TABS: ORAL_TABLET | ORAL | 0 refills | Status: AC

## 2021-05-16 MED ORDER — METOPROLOL TARTRATE 25 MG PO TABS: ORAL_TABLET | ORAL | 0 refills | Status: DC

## 2021-05-16 NOTE — Telephone Encounter (Addendum)
Patient needs lab work for eliquis ,will be seen at office visit today at 4:20 pm     Addendum at 1309 hrs: Husband cancelled appointment.Rescheduled for March.      Patient has not had current labs and was only given 1 month supply back in December 2022    I will forward to L.reid, RN, coumadin clinic

## 2021-05-16 NOTE — Telephone Encounter (Signed)
Eliquis refill approved till MD appt in March 2023.

## 2021-05-16 NOTE — Telephone Encounter (Signed)
*  STAT* If patient is at the pharmacy, call can be transferred to refill team.   1. Which medications need to be refilled? (please list name of each medication and dose if known)  apixaban (ELIQUIS) 2.5 MG TABS tablet metoprolol tartrate (LOPRESSOR) 25 MG tablet  2. Which pharmacy/location (including street and city if local pharmacy) is medication to be sent to? [facility] [address] [place] - [address]  3. Do they need a 30 day or 90 day supply?  90 day supply if possible.

## 2021-05-16 NOTE — Addendum Note (Signed)
Addended by: Louanna Raw on: 05/16/2021 03:28 PM   Modules accepted: Orders

## 2021-05-16 NOTE — Addendum Note (Signed)
Addended by: Marlyn Corporal A on: 05/16/2021 03:17 PM   Modules accepted: Orders

## 2021-05-16 NOTE — Telephone Encounter (Signed)
I gave 1 month supply lopressor

## 2021-05-18 ENCOUNTER — Emergency Department (HOSPITAL_COMMUNITY): Payer: Medicare HMO

## 2021-05-18 ENCOUNTER — Inpatient Hospital Stay (HOSPITAL_COMMUNITY)
Admission: EM | Admit: 2021-05-18 | Discharge: 2021-05-23 | DRG: 071 | Disposition: A | Discharge status: Home Health | Payer: Medicare HMO | Attending: Family Medicine | Admitting: Family Medicine

## 2021-05-18 ENCOUNTER — Encounter (HOSPITAL_COMMUNITY): Payer: Self-pay

## 2021-05-18 DIAGNOSIS — R531 Weakness: Secondary | ICD-10-CM | POA: Diagnosis not present

## 2021-05-18 DIAGNOSIS — Z681 Body mass index (BMI) 19 or less, adult: Secondary | ICD-10-CM

## 2021-05-18 DIAGNOSIS — K439 Ventral hernia without obstruction or gangrene: Secondary | ICD-10-CM | POA: Diagnosis present

## 2021-05-18 DIAGNOSIS — Z7901 Long term (current) use of anticoagulants: Secondary | ICD-10-CM

## 2021-05-18 DIAGNOSIS — G9341 Metabolic encephalopathy: Principal | ICD-10-CM | POA: Diagnosis present

## 2021-05-18 DIAGNOSIS — Z20822 Contact with and (suspected) exposure to covid-19: Secondary | ICD-10-CM | POA: Diagnosis present

## 2021-05-18 DIAGNOSIS — R64 Cachexia: Secondary | ICD-10-CM | POA: Diagnosis present

## 2021-05-18 DIAGNOSIS — F03C18 Unspecified dementia, severe, with other behavioral disturbance: Secondary | ICD-10-CM

## 2021-05-18 DIAGNOSIS — Z66 Do not resuscitate: Secondary | ICD-10-CM | POA: Diagnosis present

## 2021-05-18 DIAGNOSIS — E440 Moderate protein-calorie malnutrition: Secondary | ICD-10-CM | POA: Insufficient documentation

## 2021-05-18 DIAGNOSIS — N390 Urinary tract infection, site not specified: Secondary | ICD-10-CM | POA: Diagnosis present

## 2021-05-18 DIAGNOSIS — E46 Unspecified protein-calorie malnutrition: Secondary | ICD-10-CM | POA: Diagnosis present

## 2021-05-18 DIAGNOSIS — S300XXA Contusion of lower back and pelvis, initial encounter: Secondary | ICD-10-CM | POA: Diagnosis present

## 2021-05-18 DIAGNOSIS — R7989 Other specified abnormal findings of blood chemistry: Secondary | ICD-10-CM | POA: Diagnosis present

## 2021-05-18 DIAGNOSIS — Z8249 Family history of ischemic heart disease and other diseases of the circulatory system: Secondary | ICD-10-CM

## 2021-05-18 DIAGNOSIS — Y92009 Unspecified place in unspecified non-institutional (private) residence as the place of occurrence of the external cause: Secondary | ICD-10-CM

## 2021-05-18 DIAGNOSIS — R627 Adult failure to thrive: Secondary | ICD-10-CM

## 2021-05-18 DIAGNOSIS — F03C0 Unspecified dementia, severe, without behavioral disturbance, psychotic disturbance, mood disturbance, and anxiety: Secondary | ICD-10-CM | POA: Diagnosis present

## 2021-05-18 DIAGNOSIS — W19XXXA Unspecified fall, initial encounter: Secondary | ICD-10-CM | POA: Diagnosis present

## 2021-05-18 DIAGNOSIS — Z885 Allergy status to narcotic agent status: Secondary | ICD-10-CM

## 2021-05-18 DIAGNOSIS — I4891 Unspecified atrial fibrillation: Secondary | ICD-10-CM | POA: Diagnosis present

## 2021-05-18 DIAGNOSIS — R778 Other specified abnormalities of plasma proteins: Secondary | ICD-10-CM | POA: Diagnosis present

## 2021-05-18 DIAGNOSIS — I10 Essential (primary) hypertension: Secondary | ICD-10-CM | POA: Diagnosis present

## 2021-05-18 DIAGNOSIS — Z880 Allergy status to penicillin: Secondary | ICD-10-CM

## 2021-05-18 DIAGNOSIS — K5900 Constipation, unspecified: Secondary | ICD-10-CM | POA: Diagnosis present

## 2021-05-18 DIAGNOSIS — Z79899 Other long term (current) drug therapy: Secondary | ICD-10-CM

## 2021-05-18 DIAGNOSIS — R296 Repeated falls: Secondary | ICD-10-CM | POA: Diagnosis present

## 2021-05-18 HISTORY — DX: Unspecified dementia, unspecified severity, without behavioral disturbance, psychotic disturbance, mood disturbance, and anxiety: F03.90

## 2021-05-18 LAB — URINALYSIS, ROUTINE W REFLEX MICROSCOPIC
Bilirubin Urine: NEGATIVE
Glucose, UA: NEGATIVE mg/dL
Ketones, ur: NEGATIVE mg/dL
Leukocytes,Ua: NEGATIVE
Nitrite: NEGATIVE
Protein, ur: NEGATIVE mg/dL
Specific Gravity, Urine: 1.02 (ref 1.005–1.030)
pH: 7 (ref 5.0–8.0)

## 2021-05-18 LAB — CBC WITH DIFFERENTIAL/PLATELET
Abs Immature Granulocytes: 0.11 10*3/uL — ABNORMAL HIGH (ref 0.00–0.07)
Basophils Absolute: 0.1 10*3/uL (ref 0.0–0.1)
Basophils Relative: 1 %
Eosinophils Absolute: 0.1 10*3/uL (ref 0.0–0.5)
Eosinophils Relative: 1 %
HCT: 32.4 % — ABNORMAL LOW (ref 36.0–46.0)
Hemoglobin: 9.8 g/dL — ABNORMAL LOW (ref 12.0–15.0)
Immature Granulocytes: 1 %
Lymphocytes Relative: 8 %
Lymphs Abs: 1 10*3/uL (ref 0.7–4.0)
MCH: 20.9 pg — ABNORMAL LOW (ref 26.0–34.0)
MCHC: 30.2 g/dL (ref 30.0–36.0)
MCV: 68.9 fL — ABNORMAL LOW (ref 80.0–100.0)
Monocytes Absolute: 1.4 10*3/uL — ABNORMAL HIGH (ref 0.1–1.0)
Monocytes Relative: 12 %
Neutro Abs: 9.1 10*3/uL — ABNORMAL HIGH (ref 1.7–7.7)
Neutrophils Relative %: 77 %
Platelets: 405 10*3/uL — ABNORMAL HIGH (ref 150–400)
RBC: 4.7 MIL/uL (ref 3.87–5.11)
RDW: 20.1 % — ABNORMAL HIGH (ref 11.5–15.5)
WBC: 11.8 10*3/uL — ABNORMAL HIGH (ref 4.0–10.5)
nRBC: 0 % (ref 0.0–0.2)

## 2021-05-18 LAB — COMPREHENSIVE METABOLIC PANEL
ALT: 17 U/L (ref 0–44)
AST: 21 U/L (ref 15–41)
Albumin: 3.4 g/dL — ABNORMAL LOW (ref 3.5–5.0)
Alkaline Phosphatase: 71 U/L (ref 38–126)
Anion gap: 7 (ref 5–15)
BUN: 28 mg/dL — ABNORMAL HIGH (ref 8–23)
CO2: 24 mmol/L (ref 22–32)
Calcium: 8.8 mg/dL — ABNORMAL LOW (ref 8.9–10.3)
Chloride: 108 mmol/L (ref 98–111)
Creatinine, Ser: 1.29 mg/dL — ABNORMAL HIGH (ref 0.44–1.00)
GFR, Estimated: 39 mL/min — ABNORMAL LOW (ref >60)
Glucose, Bld: 99 mg/dL (ref 70–99)
Potassium: 4.5 mmol/L (ref 3.5–5.1)
Sodium: 139 mmol/L (ref 135–145)
Total Bilirubin: 1.4 mg/dL — ABNORMAL HIGH (ref 0.3–1.2)
Total Protein: 6.4 g/dL — ABNORMAL LOW (ref 6.5–8.1)

## 2021-05-18 LAB — RESP PANEL BY RT-PCR (FLU A&B, COVID) ARPGX2
Influenza A by PCR: NEGATIVE
Influenza B by PCR: NEGATIVE
SARS Coronavirus 2 by RT PCR: NEGATIVE

## 2021-05-18 LAB — URINALYSIS, MICROSCOPIC (REFLEX)

## 2021-05-18 MED ORDER — SODIUM CHLORIDE 0.9 % IV BOLUS
500.0000 mL | Freq: Once | INTRAVENOUS | Status: AC
  Administered 2021-05-18: 500 mL via INTRAVENOUS

## 2021-05-18 MED ORDER — IOHEXOL 300 MG/ML  SOLN
75.0000 mL | Freq: Once | INTRAMUSCULAR | Status: AC | PRN
  Administered 2021-05-18: 75 mL via INTRAVENOUS

## 2021-05-18 MED ORDER — LORAZEPAM 2 MG/ML IJ SOLN
0.5000 mg | Freq: Once | INTRAMUSCULAR | Status: AC
  Administered 2021-05-18: 0.5 mg via INTRAVENOUS
  Filled 2021-05-18: qty 1

## 2021-05-18 MED ORDER — CIPROFLOXACIN IN D5W 400 MG/200ML IV SOLN
400.0000 mg | Freq: Once | INTRAVENOUS | Status: AC
  Administered 2021-05-19: 400 mg via INTRAVENOUS
  Filled 2021-05-18: qty 200

## 2021-05-18 NOTE — ED Provider Notes (Signed)
[facility] EMERGENCY DEPARTMENT Provider Note   CSN: [id number] Arrival date & time: 05/18/21  1601     History  Chief Complaint  Patient presents with   [name]    Tracy [name] is a 88 y.o. female.  HPI Patient presents via EMS, from home with family members report that she has been declining, and has had several falls.  She is completely unable to give any history.  She is on Eliquis for atrial fibrillation.    Home Medications Prior to Admission medications   Medication Sig Start Date End Date Taking? Authorizing Provider  acetaminophen (TYLENOL) 500 MG tablet Take 1,000 mg by mouth every 6 (six) hours as needed.   Yes [provider]  apixaban (ELIQUIS) 2.5 MG TABS tablet Take 1 tablet (2.5 mg total) by mouth 2 (two) times daily. 05/16/21  Yes Strader, [place] M, PA-C  ibuprofen (ADVIL) 200 MG tablet Take 200 mg by mouth every 6 (six) hours as needed.   Yes [provider]  metoprolol tartrate (LOPRESSOR) 25 MG tablet Take 1/2 tablet twice daily 05/16/21  Yes Strader, [place] M, PA-C  potassium chloride SA (K-DUR) 20 MEQ tablet Take 1 tablet (20 mEq total) by mouth daily. Patient not taking: Reported on 05/18/2021 10/29/18   Herminio Commons, MD  torsemide (DEMADEX) 20 MG tablet Take 2 tablets (40 mg total) by mouth 2 (two) times daily. Patient not taking: Reported on 05/18/2021 01/07/20   Erma Heritage, PA-C      Allergies    Penicillins and Tylenol with codeine #3 [acetaminophen-codeine]    Review of Systems   Review of Systems  Physical Exam Updated Vital Signs BP (!) 139/93    Pulse 80    Temp 99 F (37.2 C) (Rectal)    Resp (!) 22    SpO2 100%  Physical Exam Vitals and nursing note reviewed.  Constitutional:      General: She is not in acute distress.    Appearance: She is well-developed. She is ill-appearing. She is not toxic-appearing or diaphoretic.     Comments: Underweight, chronically ill-appearing  HENT:     Head: Normocephalic  and atraumatic.     Right Ear: External ear normal.     Left Ear: External ear normal.  Eyes:     Conjunctiva/sclera: Conjunctivae normal.     Pupils: Pupils are equal, round, and reactive to light.  Neck:     Trachea: Phonation normal.  Cardiovascular:     Rate and Rhythm: Regular rhythm. Tachycardia present.     Heart sounds: Normal heart sounds.  Pulmonary:     Effort: Pulmonary effort is normal. No respiratory distress.     Breath sounds: Normal breath sounds. No stridor.  Abdominal:     General: There is no distension.     Palpations: Abdomen is soft. There is no mass.     Tenderness: There is abdominal tenderness (Abdomen is diffusely tender). There is guarding. There is no rebound.     Hernia: No hernia is present.  Musculoskeletal:        General: No swelling, tenderness, deformity or signs of injury. Normal range of motion.     Cervical back: Normal range of motion and neck supple.  Skin:    General: Skin is warm and dry.  Neurological:     Mental Status: She is alert.     Cranial Nerves: No cranial nerve deficit.     Sensory: No sensory deficit.     Motor:  No abnormal muscle tone.     Coordination: Coordination normal.  Psychiatric:        Mood and Affect: Mood normal.        Behavior: Behavior normal.    ED Results / Procedures / Treatments   Labs (all labs ordered are listed, but only abnormal results are displayed) Labs Reviewed  COMPREHENSIVE METABOLIC PANEL - Abnormal; Notable for the following components:      Result Value   BUN 28 (*)    Creatinine, Ser 1.29 (*)    Calcium 8.8 (*)    Total Protein 6.4 (*)    Albumin 3.4 (*)    Total Bilirubin 1.4 (*)    GFR, Estimated 39 (*)    All other components within normal limits  CBC WITH DIFFERENTIAL/PLATELET - Abnormal; Notable for the following components:   WBC 11.8 (*)    Hemoglobin 9.8 (*)    HCT 32.4 (*)    MCV 68.9 (*)    MCH 20.9 (*)    RDW 20.1 (*)    Platelets 405 (*)    Neutro Abs 9.1 (*)     Monocytes Absolute 1.4 (*)    Abs Immature Granulocytes 0.11 (*)    All other components within normal limits  URINALYSIS, ROUTINE W REFLEX MICROSCOPIC - Abnormal; Notable for the following components:   APPearance HAZY (*)    Hgb urine dipstick TRACE (*)    All other components within normal limits  URINALYSIS, MICROSCOPIC (REFLEX) - Abnormal; Notable for the following components:   Bacteria, UA MANY (*)    All other components within normal limits  RESP PANEL BY RT-PCR (FLU A&B, COVID) [id number]  URINE CULTURE    EKG None  Radiology DG Chest 1 View  Result Date: 05/18/2021 CLINICAL DATA:  Multiple falls. EXAM: CHEST  1 VIEW COMPARISON:  Chest x-ray 04/03/2013 FINDINGS: Heart is enlarged, unchanged. There is stable prominence of the right paratracheal region. There is some scarring in the lung apices. The lungs are otherwise clear. There is no pleural effusion or pneumothorax. No acute fractures are seen. IMPRESSION: 1. Stable cardiomegaly. 2. No acute cardiopulmonary process. Electronically Signed   By: Ronney Asters M.D.   On: 05/18/2021 22:27   CT Head Wo Contrast  Result Date: 05/18/2021 CLINICAL DATA:  Altered level of consciousness, multiple falls EXAM: CT HEAD WITHOUT CONTRAST TECHNIQUE: Contiguous axial images were obtained from the base of the skull through the vertex without intravenous contrast. RADIATION DOSE REDUCTION: This exam was performed according to the departmental dose-optimization program which includes automated exposure control, adjustment of the mA and/or kV according to patient size and/or use of iterative reconstruction technique. COMPARISON:  10/14/2015 FINDINGS: Brain: Evaluation is slightly limited by patient motion during the exam. Chronic right basal ganglia lacunar infarct again noted. There are chronic small-vessel ischemic changes throughout the periventricular white matter. No evidence of acute infarct or hemorrhage. The lateral ventricles and remaining  midline structures are otherwise unremarkable. No acute extra-axial fluid collections. No mass effect. Vascular: No hyperdense vessel or unexpected calcification. Skull: Normal. Negative for fracture or focal lesion. Sinuses/Orbits: No acute finding. Other: None. IMPRESSION: 1. Stable head CT, no acute intracranial process. Electronically Signed   By: Randa Ngo M.D.   On: 05/18/2021 22:39   CT Abdomen Pelvis W Contrast  Result Date: 05/18/2021 CLINICAL DATA:  Evaluate for bowel obstruction.  Multiple falls. EXAM: CT ABDOMEN AND PELVIS WITH CONTRAST TECHNIQUE: Multidetector CT imaging of the abdomen and pelvis was performed  using the standard protocol following bolus administration of intravenous contrast. RADIATION DOSE REDUCTION: This exam was performed according to the departmental dose-optimization program which includes automated exposure control, adjustment of the mA and/or kV according to patient size and/or use of iterative reconstruction technique. CONTRAST:  74mL OMNIPAQUE IOHEXOL 300 MG/ML  SOLN COMPARISON:  None. FINDINGS: Lower chest: The heart is enlarged.  Lung bases are grossly clear. Hepatobiliary: There is a homogeneously enhancing lesion in the left lobe of the liver measuring 1.4 x 1.9 cm. This is isodense to the liver on delayed imaging. No other liver lesions are identified. Gallbladder is within normal limits. No biliary ductal dilatation. Pancreas: Limited evaluation secondary to motion artifact. No definite acute or focal abnormality. Spleen: Normal in size without focal abnormality. Adrenals/Urinary Tract: Limited evaluation secondary to motion artifact. No hydronephrosis or perinephric fluid. Adrenal glands and bladder are within normal limits. Stomach/Bowel: No dilated bowel loops identified. There is colonic diverticulosis without evidence for diverticulitis. The appendix is not seen. The stomach is within normal limits. There is a large amount of stool dilating the rectum. Rectum  measures 7.4 cm in diameter. Vascular/Lymphatic: Aortic atherosclerosis. No enlarged abdominal or pelvic lymph nodes. Reproductive: There is a 1.7 cm right adnexal cyst. Left ovary not well seen. Uterus appears atrophic. Other: There is no ascites or free air. There is a large ventral hernia containing portions of the stomach, pancreas, large bowel and small bowel. Wall defect measures 4.8 by 6.3 cm. Hernia sac is left of midline on this study measuring 14.5 x 7 by 14.8 cm. There is no edema in the hernia sac. Musculoskeletal: Bones are diffusely osteopenic. Multilevel degenerative changes affect the spine. No acute fractures are seen. There is intramuscular hyperdensity in surrounding subcutaneous stranding in the inferior right gluteus muscle, incompletely imaged. IMPRESSION: 1. Large ventral hernia containing colon, small bowel, pancreas and stomach. No evidence for bowel obstruction. 2. Right gluteal musculature hyperdensity with surrounding edema may represent hematoma. Please correlate clinically. Differential diagnosis would include infection or other soft tissue mass. 3. The rectum is dilated and stool-filled. 4. Sigmoid colon diverticulosis. 5. 1.7 cm right adnexal simple cysts. Note: This recommendation does not apply to premenarchal patients and to those with increased risk (genetic, family history, elevated tumor markers or other high-risk factors) of ovarian cancer. Reference: JACR 2020 Feb; 17(2):248-254 6. Enhancing liver lesion measuring 1.9 cm. Findings may represent a flash fill hemangioma, but are indeterminate. Recommend further characterization with MRI. 7.  Aortic Atherosclerosis (ICD10-I70.0). Electronically Signed   By: Ronney Asters M.D.   On: 05/18/2021 22:40    Procedures Procedures    Medications Ordered in ED Medications  ciprofloxacin (CIPRO) IVPB 400 mg (has no administration in time range)  sodium chloride 0.9 % bolus 500 mL (0 mLs Intravenous Stopped 05/18/21 2117)  LORazepam  (ATIVAN) injection 0.5 mg (0.5 mg Intravenous Given 05/18/21 1944)  iohexol (OMNIPAQUE) 300 MG/ML solution 75 mL (75 mLs Intravenous Contrast Given 05/18/21 2154)    ED Course/ Medical Decision Making/ A&P Clinical Course as of 05/18/21 2358  Wed May 18, 2021  2328 I was able to reach the patient's son, Tracy [name] who is listed in the EMR.  He states that she had a fall today.  The family had trouble getting her up. Dementia is worsening. Also fell 4 days ago. Taking Tylenol for pain. Awoke early today. Today walked into a closet by accident. Pain seemed worse today. Then she fell off bed. Dementia sx come and go.  70% of time she is confused. Her appitite has been worse for 1 week. Didn't eat or drink today.  [EW]  2353 Patient continues to moan and be very confused.  She is unable to give history.  I discussed the findings and recent history with her son by telephone. [EW]    Clinical Course User Index [EW] Daleen Bo, MD                           Medical Decision Making Patient is here for worsening confusion, decreased appetite and to be evaluated for falls.  She is unable to give any history.  She has not seen her PCP for several years.  Her son thinks that she has dementia  Problems Addressed: Constipation, unspecified constipation type: self-limited or minor problem Severe dementia with other behavioral disturbance, unspecified dementia type: chronic illness or injury that poses a threat to life or bodily functions    Details: Gradually worsening dementia symptoms, associated with elderly status. Urinary tract infection without hematuria, site unspecified: undiagnosed new problem with uncertain prognosis    Details: Potentially a cause of her worsening confusion Weakness: undiagnosed new problem with uncertain prognosis    Details: Unclear if this is related to chronic disability, decreased oral intake or dementia.  Amount and/or Complexity of Data Reviewed Independent Historian:  caregiver    Details: Patient's son by telephone Labs: ordered.    Details: CBC, metabolic panel, urinalysis, respiratory panel-findings normal except BUN and creatinine elevated, calcium low, total protein low, albumin low, total bilirubin high, GFR low, white count high, hemoglobin low, urinalysis consistent with infection. Radiology: ordered and independent interpretation performed.    Details: CT head and chest x-ray, no acute injuries or evidence for infection. Discussion of management or test interpretation with external provider(s): Consultation with hospitalist to arrange admission-she agrees to admit the patient  Risk Prescription drug management. Decision regarding hospitalization. Risk Details: With falls at home, despite assistance from family members, she has distinct worsening in her symptoms over the last week, concerning for acute encephalopathy versus worsening chronic condition.  She has had decreased appetite.  She has a chronic hernia of her abdomen but no acute findings in the abdomen other than increased stool in the distal colon.  There is no fecal impaction.  Family is having trouble caring for her at home in this condition.  We will seek hospitalization for observation and treatment of UTI.  She will likely benefit from evaluation by PT/OT, and consideration for different level of care.  Critical Care Total time providing critical care: 30-74 minutes          Final Clinical Impression(s) / ED Diagnoses Final diagnoses:  Urinary tract infection without hematuria, site unspecified  Constipation, unspecified constipation type  Severe dementia with other behavioral disturbance, unspecified dementia type    Rx / DC Orders ED Discharge Orders     None         Daleen Bo, MD 05/19/21 0008

## 2021-05-18 NOTE — ED Triage Notes (Signed)
Pt arrived via [facility] for multiple falls x 4 days. Per EMS, family states she has been declining cognitively for a while, but does not have an official diagnosis of dementia.

## 2021-05-19 ENCOUNTER — Inpatient Hospital Stay (HOSPITAL_COMMUNITY)
Admit: 2021-05-19 | Discharge: 2021-05-19 | Disposition: A | Discharge status: Home / Self Care | Payer: Medicare HMO | Attending: Family Medicine | Admitting: Family Medicine

## 2021-05-19 DIAGNOSIS — Z515 Encounter for palliative care: Secondary | ICD-10-CM

## 2021-05-19 DIAGNOSIS — R627 Adult failure to thrive: Secondary | ICD-10-CM

## 2021-05-19 DIAGNOSIS — Z880 Allergy status to penicillin: Secondary | ICD-10-CM | POA: Diagnosis not present

## 2021-05-19 DIAGNOSIS — R778 Other specified abnormalities of plasma proteins: Secondary | ICD-10-CM | POA: Diagnosis present

## 2021-05-19 DIAGNOSIS — Z885 Allergy status to narcotic agent status: Secondary | ICD-10-CM | POA: Diagnosis not present

## 2021-05-19 DIAGNOSIS — Z7901 Long term (current) use of anticoagulants: Secondary | ICD-10-CM | POA: Diagnosis not present

## 2021-05-19 DIAGNOSIS — Z8249 Family history of ischemic heart disease and other diseases of the circulatory system: Secondary | ICD-10-CM | POA: Diagnosis not present

## 2021-05-19 DIAGNOSIS — W19XXXA Unspecified fall, initial encounter: Secondary | ICD-10-CM

## 2021-05-19 DIAGNOSIS — Z79899 Other long term (current) drug therapy: Secondary | ICD-10-CM | POA: Diagnosis not present

## 2021-05-19 DIAGNOSIS — R296 Repeated falls: Secondary | ICD-10-CM | POA: Diagnosis present

## 2021-05-19 DIAGNOSIS — S300XXA Contusion of lower back and pelvis, initial encounter: Secondary | ICD-10-CM | POA: Diagnosis present

## 2021-05-19 DIAGNOSIS — G9341 Metabolic encephalopathy: Secondary | ICD-10-CM

## 2021-05-19 DIAGNOSIS — K439 Ventral hernia without obstruction or gangrene: Secondary | ICD-10-CM | POA: Diagnosis present

## 2021-05-19 DIAGNOSIS — Z66 Do not resuscitate: Secondary | ICD-10-CM | POA: Diagnosis present

## 2021-05-19 DIAGNOSIS — K5900 Constipation, unspecified: Secondary | ICD-10-CM | POA: Diagnosis present

## 2021-05-19 DIAGNOSIS — E441 Mild protein-calorie malnutrition: Secondary | ICD-10-CM

## 2021-05-19 DIAGNOSIS — Z20822 Contact with and (suspected) exposure to covid-19: Secondary | ICD-10-CM | POA: Diagnosis present

## 2021-05-19 DIAGNOSIS — Y92009 Unspecified place in unspecified non-institutional (private) residence as the place of occurrence of the external cause: Secondary | ICD-10-CM | POA: Diagnosis not present

## 2021-05-19 DIAGNOSIS — I4821 Permanent atrial fibrillation: Secondary | ICD-10-CM

## 2021-05-19 DIAGNOSIS — R64 Cachexia: Secondary | ICD-10-CM | POA: Diagnosis present

## 2021-05-19 DIAGNOSIS — I4891 Unspecified atrial fibrillation: Secondary | ICD-10-CM | POA: Diagnosis present

## 2021-05-19 DIAGNOSIS — F03C0 Unspecified dementia, severe, without behavioral disturbance, psychotic disturbance, mood disturbance, and anxiety: Secondary | ICD-10-CM | POA: Diagnosis present

## 2021-05-19 DIAGNOSIS — R531 Weakness: Secondary | ICD-10-CM | POA: Diagnosis present

## 2021-05-19 DIAGNOSIS — N390 Urinary tract infection, site not specified: Secondary | ICD-10-CM | POA: Diagnosis present

## 2021-05-19 DIAGNOSIS — Z681 Body mass index (BMI) 19 or less, adult: Secondary | ICD-10-CM | POA: Diagnosis not present

## 2021-05-19 DIAGNOSIS — R7989 Other specified abnormal findings of blood chemistry: Secondary | ICD-10-CM | POA: Diagnosis present

## 2021-05-19 DIAGNOSIS — E440 Moderate protein-calorie malnutrition: Secondary | ICD-10-CM

## 2021-05-19 DIAGNOSIS — I10 Essential (primary) hypertension: Secondary | ICD-10-CM | POA: Diagnosis present

## 2021-05-19 DIAGNOSIS — E46 Unspecified protein-calorie malnutrition: Secondary | ICD-10-CM | POA: Diagnosis present

## 2021-05-19 LAB — CBC WITH DIFFERENTIAL/PLATELET
Abs Immature Granulocytes: 0.08 10*3/uL — ABNORMAL HIGH (ref 0.00–0.07)
Basophils Absolute: 0.2 10*3/uL — ABNORMAL HIGH (ref 0.0–0.1)
Basophils Relative: 2 %
Eosinophils Absolute: 0.1 10*3/uL (ref 0.0–0.5)
Eosinophils Relative: 1 %
HCT: 33.7 % — ABNORMAL LOW (ref 36.0–46.0)
Hemoglobin: 9.9 g/dL — ABNORMAL LOW (ref 12.0–15.0)
Immature Granulocytes: 1 %
Lymphocytes Relative: 7 %
Lymphs Abs: 0.9 10*3/uL (ref 0.7–4.0)
MCH: 20.5 pg — ABNORMAL LOW (ref 26.0–34.0)
MCHC: 29.4 g/dL — ABNORMAL LOW (ref 30.0–36.0)
MCV: 69.9 fL — ABNORMAL LOW (ref 80.0–100.0)
Monocytes Absolute: 1.5 10*3/uL — ABNORMAL HIGH (ref 0.1–1.0)
Monocytes Relative: 13 %
Neutro Abs: 9 10*3/uL — ABNORMAL HIGH (ref 1.7–7.7)
Neutrophils Relative %: 76 %
Platelets: 366 10*3/uL (ref 150–400)
RBC: 4.82 MIL/uL (ref 3.87–5.11)
RDW: 20.6 % — ABNORMAL HIGH (ref 11.5–15.5)
WBC: 11.7 10*3/uL — ABNORMAL HIGH (ref 4.0–10.5)
nRBC: 0 % (ref 0.0–0.2)

## 2021-05-19 LAB — AMMONIA: Ammonia: 32 umol/L (ref 9–35)

## 2021-05-19 LAB — COMPREHENSIVE METABOLIC PANEL
ALT: 15 U/L (ref 0–44)
AST: 29 U/L (ref 15–41)
Albumin: 3.4 g/dL — ABNORMAL LOW (ref 3.5–5.0)
Alkaline Phosphatase: 71 U/L (ref 38–126)
Anion gap: 8 (ref 5–15)
BUN: 25 mg/dL — ABNORMAL HIGH (ref 8–23)
CO2: 22 mmol/L (ref 22–32)
Calcium: 8.6 mg/dL — ABNORMAL LOW (ref 8.9–10.3)
Chloride: 105 mmol/L (ref 98–111)
Creatinine, Ser: 1.08 mg/dL — ABNORMAL HIGH (ref 0.44–1.00)
GFR, Estimated: 48 mL/min — ABNORMAL LOW (ref >60)
Glucose, Bld: 99 mg/dL (ref 70–99)
Potassium: 4 mmol/L (ref 3.5–5.1)
Sodium: 135 mmol/L (ref 135–145)
Total Bilirubin: 1 mg/dL (ref 0.3–1.2)
Total Protein: 6.5 g/dL (ref 6.5–8.1)

## 2021-05-19 LAB — FOLATE: Folate: 9.2 ng/mL (ref >5.9)

## 2021-05-19 LAB — T4, FREE: Free T4: 1.33 ng/dL — ABNORMAL HIGH (ref 0.61–1.12)

## 2021-05-19 LAB — VITAMIN B12: Vitamin B-12: 444 pg/mL (ref 180–914)

## 2021-05-19 LAB — TSH
TSH: 3.27 u[IU]/mL (ref 0.350–4.500)
TSH: 4.371 u[IU]/mL (ref 0.350–4.500)

## 2021-05-19 LAB — MRSA NEXT GEN BY PCR, NASAL: MRSA by PCR Next Gen: NOT DETECTED

## 2021-05-19 LAB — TROPONIN I (HIGH SENSITIVITY)
Troponin I (High Sensitivity): 70 ng/L — ABNORMAL HIGH (ref <18)
Troponin I (High Sensitivity): 75 ng/L — ABNORMAL HIGH (ref <18)

## 2021-05-19 LAB — MAGNESIUM: Magnesium: 1.9 mg/dL (ref 1.7–2.4)

## 2021-05-19 MED ORDER — OXYCODONE HCL 5 MG PO TABS
5.0000 mg | ORAL_TABLET | ORAL | Status: DC | PRN
  Administered 2021-05-21 – 2021-05-22 (×2): 5 mg via ORAL
  Filled 2021-05-19 (×2): qty 1

## 2021-05-19 MED ORDER — ACETAMINOPHEN 650 MG RE SUPP: 650.0000 mg | Freq: Four times a day (QID) | RECTAL | Status: DC | PRN

## 2021-05-19 MED ORDER — ONDANSETRON HCL 4 MG PO TABS: 4.0000 mg | ORAL_TABLET | Freq: Four times a day (QID) | ORAL | Status: DC | PRN

## 2021-05-19 MED ORDER — ACETAMINOPHEN 325 MG PO TABS: 650.0000 mg | ORAL_TABLET | Freq: Four times a day (QID) | ORAL | Status: DC | PRN

## 2021-05-19 MED ORDER — HALOPERIDOL LACTATE 5 MG/ML IJ SOLN
1.0000 mg | Freq: Once | INTRAMUSCULAR | Status: AC
  Administered 2021-05-19: 1 mg via INTRAVENOUS
  Filled 2021-05-19: qty 1

## 2021-05-19 MED ORDER — ONDANSETRON HCL 4 MG/2ML IJ SOLN: 4.0000 mg | Freq: Four times a day (QID) | INTRAMUSCULAR | Status: DC | PRN

## 2021-05-19 MED ORDER — ZIPRASIDONE HCL 20 MG PO CAPS
20.0000 mg | ORAL_CAPSULE | Freq: Two times a day (BID) | ORAL | Status: DC
  Filled 2021-05-19 (×8): qty 1

## 2021-05-19 MED ORDER — METOPROLOL TARTRATE 25 MG PO TABS
12.5000 mg | ORAL_TABLET | Freq: Two times a day (BID) | ORAL | Status: DC
  Filled 2021-05-19 (×5): qty 1

## 2021-05-19 MED ORDER — CIPROFLOXACIN IN D5W 400 MG/200ML IV SOLN
400.0000 mg | Freq: Two times a day (BID) | INTRAVENOUS | Status: DC
  Administered 2021-05-19 – 2021-05-20 (×3): 400 mg via INTRAVENOUS
  Filled 2021-05-19 (×3): qty 200

## 2021-05-19 MED ORDER — APIXABAN 2.5 MG PO TABS
2.5000 mg | ORAL_TABLET | Freq: Two times a day (BID) | ORAL | Status: DC
  Filled 2021-05-19 (×5): qty 1

## 2021-05-19 MED ORDER — HALOPERIDOL LACTATE 5 MG/ML IJ SOLN: 2.0000 mg | Freq: Four times a day (QID) | INTRAMUSCULAR | Status: DC | PRN

## 2021-05-19 MED ORDER — CHLORHEXIDINE GLUCONATE CLOTH 2 % EX PADS
6.0000 | MEDICATED_PAD | Freq: Every day | CUTANEOUS | Status: DC
  Administered 2021-05-19 – 2021-05-20 (×2): 6 via TOPICAL

## 2021-05-19 MED ORDER — ENSURE ENLIVE PO LIQD
237.0000 mL | Freq: Two times a day (BID) | ORAL | Status: DC
  Filled 2021-05-19 (×6): qty 237

## 2021-05-19 NOTE — Evaluation (Signed)
Physical Therapy Evaluation Patient Details Name: Tracy [name] MRN: [id number] DOB: [birth year] Today's Date: 05/19/2021  History of Present Illness  Tracy [name] is a 88 y.o. female with medical history significant of history of atrial fibrillation, CKD, dementia, hypertension, and more presents ED with a chief complaint of fall and worsening dementia.  Unfortunately patient is not able to provide any history at all.  At the time of my exam she is moving around in the bed and talking incoherently.  ED provider reports that he was able to reach family who stated that the patient's dementia is worsening.  She is also having increased number of falls in the past few days.  She fell once today, and fell once 4 days ago.  She has been taking Tylenol for the pain status post falls.  She also walked into the closet by accident today.  They also reported decrease in p.o. intake for 1 week.  She did not eat anything on the day of presentation.  ED provider also noted abdominal tenderness which is not noted on my exam.  She does have a massive ventral hernia on the left side of her abdomen.  No further history could be obtained at this time.   Clinical Impression  Patient presents lethargic with difficulty keeping eyes open and required repeated verbal/tactile cueing to participate with therapy.  Patient demonstrates slow labored movement for sitting up at bedside with c/o severe pain LLE with pressure, limited to standing for few seconds before losing balance due to feet sliding forward requiring Max assist to prevent falling.  Patient put back to bed with Max assist to reposition.  Patient will benefit from continued skilled physical therapy in hospital and recommended venue below to increase strength, balance, endurance for safe ADLs and gait.         Recommendations for follow up therapy are one component of a multi-disciplinary discharge planning process, led by the attending physician.   Recommendations may be updated based on patient status, additional functional criteria and insurance authorization.  Follow Up Recommendations Skilled nursing-short term rehab (<3 hours/day)    Assistance Recommended at Discharge Frequent or constant Supervision/Assistance  Patient can return home with the following  A lot of help with bathing/dressing/bathroom;A lot of help with walking and/or transfers;Help with stairs or ramp for entrance;Assistance with cooking/housework    Equipment Recommendations None recommended by PT  Recommendations for Other Services       Functional Status Assessment Patient has had a recent decline in their functional status and demonstrates the ability to make significant improvements in function in a reasonable and predictable amount of time.     Precautions / Restrictions Precautions Precautions: Fall Restrictions Weight Bearing Restrictions: No      Mobility  Bed Mobility Overal bed mobility: Needs Assistance Bed Mobility: Supine to Sit, Sit to Supine     Supine to sit: Max assist Sit to supine: Max assist        Transfers Overall transfer level: Needs assistance Equipment used: Rolling walker (2 wheels) Transfers: Sit to/from Stand Sit to Stand: Max assist           General transfer comment: unable to maintain standing balance with bilateral hand held assist due to feet sliding forward and weakness    Ambulation/Gait                  Stairs            Wheelchair Mobility    Modified  Rankin (Stroke Patients Only)       Balance Overall balance assessment: Needs assistance Sitting-balance support: Feet supported, No upper extremity supported Sitting balance-Leahy Scale: Poor Sitting balance - Comments: seated at EOB   Standing balance support: During functional activity Standing balance-Leahy Scale: Poor Standing balance comment: bilateral hand held assist                              Pertinent Vitals/Pain Pain Assessment Pain Assessment: Faces Faces Pain Scale: Hurts even more Pain Location: RLE with pressure Pain Descriptors / Indicators: Grimacing, Guarding, Sore Pain Intervention(s): Limited activity within patient's tolerance, Monitored during session, Repositioned    Home Living Family/patient expects to be discharged to:: Private residence Living Arrangements: Children Available Help at Discharge: Family;Available 24 hours/day Type of Home: House         Home Layout: One level Home Equipment: Conservation officer, nature (2 wheels);Shower seat Additional Comments: Info taken from previous admission, patient is poor historian    Prior Function               Mobility Comments: household ambulator using RW ADLs Comments: assisted by family     Hand Dominance   Dominant Hand: Right    Extremity/Trunk Assessment   Upper Extremity Assessment Upper Extremity Assessment: Generalized weakness    Lower Extremity Assessment Lower Extremity Assessment: Generalized weakness    Cervical / Trunk Assessment Cervical / Trunk Assessment: Normal  Communication   Communication: HOH  Cognition Arousal/Alertness: Lethargic Behavior During Therapy: Restless, Agitated Overall Cognitive Status: No family/caregiver present to determine baseline cognitive functioning                                          General Comments      Exercises     Assessment/Plan    PT Assessment Patient needs continued PT services  PT Problem List Decreased strength;Decreased activity tolerance;Decreased balance;Decreased mobility       PT Treatment Interventions DME instruction;Gait training;Stair training;Functional mobility training;Therapeutic activities;Therapeutic exercise;Patient/family education;Balance training    PT Goals (Current goals can be found in the Care Plan section)  Acute Rehab PT Goals Patient Stated Goal: not stated PT Goal  Formulation: With patient Time For Goal Achievement: 06/02/21 Potential to Achieve Goals: Fair    Frequency Min 3X/week     Co-evaluation               AM-PAC PT "6 Clicks" Mobility  Outcome Measure Help needed turning from your back to your side while in a flat bed without using bedrails?: A Lot Help needed moving from lying on your back to sitting on the side of a flat bed without using bedrails?: A Lot Help needed moving to and from a bed to a chair (including a wheelchair)?: A Lot Help needed standing up from a chair using your arms (e.g., wheelchair or bedside chair)?: A Lot Help needed to walk in hospital room?: Total Help needed climbing 3-5 steps with a railing? : Total 6 Click Score: 10    End of Session   Activity Tolerance: Patient limited by fatigue;Patient limited by lethargy Patient left: in bed;with call bell/phone within reach Nurse Communication: Mobility status PT Visit Diagnosis: Unsteadiness on feet (R26.81);Other abnormalities of gait and mobility (R26.89);Muscle weakness (generalized) (M62.81)    Time: [id number] PT Time Calculation (min) (ACUTE ONLY):  18 min   Charges:   PT Evaluation $PT Eval Low Complexity: 1 Low PT Treatments $Therapeutic Activity: 8-22 mins        1:59 PM, 05/19/21 Lonell Grandchild, MPT Physical Therapist with [facility]: [address] [phone] office [phone] mobile phone

## 2021-05-19 NOTE — ED Notes (Signed)
EEG being performed at this time °

## 2021-05-19 NOTE — Assessment & Plan Note (Addendum)
Troponin 70 Lateral leads ST depression which has been more subtly visible on previous EKGs Repeat troponin Continue to monitor on telemetry -Remained stable

## 2021-05-19 NOTE — TOC Initial Note (Addendum)
Transition of Care [facility]) - Initial/Assessment Note    Patient Details  Name: Tracy [name] MRN: [id number] Date of Birth: [birth year]  [facility] CM/SW Contact:    Boneta Lucks, RN Phone Number: 05/19/2021, 1:55 PM  Clinical Narrative:   Patient admitted with acute metabolic encephalopathy. Patient lives with her son. He states 5 days ago she was walking independently. No equipment needed. He states she has poor vision, but does well in the home. As long as she feels better after antibiotics for UTI, they plan to take her home. They would be open to home health.    Addendum : Patient declining. MD had palliative discussion with family. They will get back with Palliative vs Hospice referral.         Expected Discharge Plan: [place] Barriers to Discharge: Continued Medical Work up  Patient Goals and CMS Choice Patient states their goals for this hospitalization and ongoing recovery are:: to go home.   Choice offered to / list presented to : Adult Children  Expected Discharge Plan and Services Expected Discharge Plan: [place]      Living arrangements for the past 2 months: Single Family Home                   Prior Living Arrangements/Services Living arrangements for the past 2 months: Single Family Home Lives with:: Adult Children Patient language and need for interpreter reviewed:: Yes        Need for Family Participation in Patient Care: Yes (Comment) Care giver support system in place?: Yes (comment)   Criminal Activity/Legal Involvement Pertinent to Current Situation/Hospitalization: No - Comment as needed  Activities of Daily Living    Emotional Assessment    Affect (typically observed): Accepting   Alcohol / Substance Use: Not Applicable Psych Involvement: No (comment)  Admission diagnosis:  Weakness [R53.1] Urinary tract infection without hematuria, site unspecified [N39.0] Constipation, unspecified  constipation type [id number] Acute metabolic encephalopathy [id number] Severe dementia with other behavioral disturbance, unspecified dementia type [F03.C18] Patient Active Problem List   Diagnosis Date Noted   Acute metabolic encephalopathy [id number]   Fall at home, initial encounter 05/19/2021   Protein calorie malnutrition ([place]) 05/19/2021   Elevated troponin 05/19/2021   Failure to thrive in adult 05/19/2021   TIA (transient ischemic attack) 10/14/2015   Chronic anticoagulation 10/14/2015   Bilateral lower extremity edema 10/14/2015   HTN (hypertension) 10/14/2015   Atrial fibrillation ([place]) [I48.91] 09/14/2015   Encounter for therapeutic drug monitoring 09/14/2015   PCP:  Lyman Bishop, DO Pharmacy:   [facility] [address], [address] [place] [address] Phone: [phone] Fax: [phone]   Readmission Risk Interventions Readmission Risk Prevention Plan 05/19/2021  Medication Screening Complete  Transportation Screening Complete  Some recent data might be hidden

## 2021-05-19 NOTE — Progress Notes (Signed)
Palliative: °Thank you for this consult. Unfortunately due to high volume of consults there will be a delay in a Palliative Provider seeing this patient.   Palliative Medicine will return to service on 05/23/21 and will see patient at that time. ° °No charge °Maidie Streight, NP °Palliative Medicine °Please call Palliative Medicine team phone with any questions [phone]. °For individual providers please see AMION. °

## 2021-05-19 NOTE — Assessment & Plan Note (Addendum)
Body mass index is 20.99 kg/m.  -Likely exacerbated by poor p.o. intake due to dementia -With cachexia, nutrition consulted, continue calorie count, dietary supplement if she would tolerate and take p.o. -Patient continues to have very poor oral intake gentle IV fluid hydration has been initiated to the patient's family make the final decision regarding treatment plan and goals of care

## 2021-05-19 NOTE — Plan of Care (Signed)
°  Problem: Acute Rehab PT Goals(only PT should resolve) Goal: Pt Will Go Supine/Side To Sit Outcome: Progressing Flowsheets (Taken 05/19/2021 1400) Pt will go Supine/Side to Sit:  with minimal assist  with moderate assist Goal: Patient Will Transfer Sit To/From Stand Outcome: Progressing Flowsheets (Taken 05/19/2021 1400) Patient will transfer sit to/from stand:  with minimal assist  with moderate assist Goal: Pt Will Transfer Bed To Chair/Chair To Bed Outcome: Progressing Flowsheets (Taken 05/19/2021 1400) Pt will Transfer Bed to Chair/Chair to Bed: with mod assist Goal: Pt Will Ambulate Outcome: Progressing Flowsheets (Taken 05/19/2021 1400) Pt will Ambulate:  25 feet  with moderate assist  with rolling walker   2:01 PM, 05/19/21 Ocie Bob, MPT Physical Therapist with [facility] [phone] office [phone] mobile phone

## 2021-05-19 NOTE — Procedures (Signed)
Patient Name: DORINDA STEHR  MRN: [id number]  Epilepsy Attending: Charlsie Quest  Referring Physician/Provider: Lilyan Gilford, DO Date: 05/19/2021 Duration: 23.45 mins  Patient history: 88yo F with ams. EEG to evaluate for seizure  Level of alertness: Awake, asleep  AEDs during EEG study: None  Technical aspects: This EEG study was done with scalp electrodes positioned according to the 10-20 International system of electrode placement. Electrical activity was acquired at a sampling rate of 500Hz  and reviewed with a high frequency filter of 70Hz  and a low frequency filter of 1Hz . EEG data were recorded continuously and digitally stored.   Description: The posterior dominant rhythm consists of 10 Hz activity of moderate voltage (25-35 uV) seen predominantly in posterior head regions, symmetric and reactive to eye opening and eye closing. Sleep was characterized by vertex waves, sleep spindles (12 to 14 Hz), maximal frontocentral region. Hyperventilation did not show any EEG change.  Physiologic photic driving was not seen during photic stimulation.    IMPRESSION: This study is within normal limits. No seizures or epileptiform discharges were seen throughout the recording.  Gisella Alwine 

## 2021-05-19 NOTE — H&P (Signed)
History and Physical    Patient: Tracy [name] [id number] DOB: 1926/06/26 DOA: 05/18/2021 DOS: the patient was seen and examined on 05/19/2021 PCP: Lyman Bishop, DO  Patient coming from: Home  Chief Complaint:  Chief Complaint  Patient presents with   Fall    HPI: Tracy [name] is a 88 y.o. female with medical history significant of history of atrial fibrillation, CKD, dementia, hypertension, and more presents ED with a chief complaint of fall and worsening dementia.  Unfortunately patient is not able to provide any history at all.  At the time of my exam she is moving around in the bed and talking incoherently.  ED provider reports that he was able to reach family who stated that the patient's dementia is worsening.  She is also having increased number of falls in the past few days.  She fell once today, and fell once 4 days ago.  She has been taking Tylenol for the pain status post falls.  She also walked into the closet by accident today.  They also reported decrease in p.o. intake for 1 week.  She did not eat anything on the day of presentation.  ED provider also noted abdominal tenderness which is not noted on my exam.  She does have a massive ventral hernia on the left side of her abdomen.  No further history could be obtained at this time.  In the ED Temp 99, heart rate 78-110, respiratory rate 17-22, blood pressure 136/110-monitoring 39/90.,  Satting at 100% Patient does have slight leukocytosis 11.8 and thrombocytosis 405-likely due to acute phase reaction Chemistry panel is unremarkable Albumin 3.4, T. bili 1.4 Negative respiratory panel UA is borderline CT abdomen pelvis shows large ventral hernia with right gluteal hematoma CT head shows nothing acute Chest x-ray shows no acute abnormalities EKG shows heart rate 83, atrial fibrillation, normal QTc, some ST depression has been present on previous Patient was given Ativan, urine culture ordered, Cipro started  (penicillin allergy), normal saline 500 mL bolus Admission requested for altered mental status, likely needing placement  Review of Systems: unable to review all systems due to the inability of the patient to answer questions. Past Medical History:  Diagnosis Date   A-fib [place])    Chronic kidney disease    hx kidney infection   Dementia (HCC)    Hernia, abdominal    Hypertension    Shortness of breath    Past Surgical History:  Procedure Laterality Date   APPENDECTOMY     CATARACT EXTRACTION W/PHACO Right 08/28/2012   Procedure: CATARACT EXTRACTION PHACO AND INTRAOCULAR LENS PLACEMENT ([place]);  Surgeon: Marylynn Pearson, MD;  Location: [place];  Service: Ophthalmology;  Laterality: Right;   HERNIA REPAIR     umbilical   TUBAL LIGATION     Social History:  reports that she has never smoked. She has never used smokeless tobacco. She reports that she does not drink alcohol and does not use drugs.  Allergies  Allergen Reactions   Penicillins Shortness Of Breath, Swelling and Rash    Patient is currently unable to answer questions about penicillin allergy   Tylenol With Codeine #3 [Acetaminophen-Codeine] Other (See Comments)    Nervousness  "jitters"    Family History  Problem Relation Age of Onset   Heart disease Mother    Heart attack Mother    Heart disease Sister    Heart failure Brother    Stroke Brother    Cancer Sister     Prior to  Admission medications   Medication Sig Start Date End Date Taking? Authorizing Provider  acetaminophen (TYLENOL) 500 MG tablet Take 1,000 mg by mouth every 6 (six) hours as needed.   Yes [provider]  apixaban (ELIQUIS) 2.5 MG TABS tablet Take 1 tablet (2.5 mg total) by mouth 2 (two) times daily. 05/16/21  Yes Strader, [place] M, PA-C  ibuprofen (ADVIL) 200 MG tablet Take 200 mg by mouth every 6 (six) hours as needed.   Yes [provider]  metoprolol tartrate (LOPRESSOR) 25 MG tablet Take 1/2 tablet twice daily 05/16/21  Yes  Strader, [place] M, PA-C  potassium chloride SA (K-DUR) 20 MEQ tablet Take 1 tablet (20 mEq total) by mouth daily. Patient not taking: Reported on 05/18/2021 10/29/18   Herminio Commons, MD  torsemide (DEMADEX) 20 MG tablet Take 2 tablets (40 mg total) by mouth 2 (two) times daily. Patient not taking: Reported on 05/18/2021 01/07/20   Erma Heritage, PA-C    Physical Exam: Vitals:   05/18/21 2009 05/18/21 2010 05/18/21 2100 05/19/21 0400  BP:  138/87 (!) 139/93 (!) 137/117  Pulse:  78 80 88  Resp:  17 (!) 22 18  Temp: 99 F (37.2 C)     TempSrc: Rectal     SpO2:  100% 100% 100%   1.  General: Patient lying supine in bed,  no acute distress   2. Psychiatric: Speaking incoherently, not following commands or answering questions appropriately    3. Neurologic: No slurring of speech but language is incoherent, face is symmetric, moves all 4 extremities voluntarily   4. HEENMT:   normocephalic, pupils reactive to light, neck is cachectic, trachea is midline, mucous membranes are moist   5. Respiratory : Lungs are clear to auscultation bilaterally without wheezing, rhonchi, rales, no cyanosis, no increase in work of breathing or accessory muscle use   6. Cardiovascular : Heart rate normal, rhythm is regular, no murmurs, rubs or gallops, no peripheral edema, peripheral pulses palpated   7. Gastrointestinal:  Abdomen is soft, nondistended, nontender to palpation, large soft ventral hernia on left half of the abdomen, no masses or organomegaly palpated   8. Skin:  Left knee and above right eyebrow   9.Musculoskeletal:  No acute deformities, no asymmetry in tone, no peripheral edema, peripheral pulses palpated, no tenderness to palpation in the extremities Left lateral decubitus in bed, repositioning often  Data Reviewed:   Labs reviewed from the ED and revealed a slight leukocytosis with thrombocytosis, protein calorie malnutrition, elevated T. bili, negative respiratory  panel, borderline UA with urine culture pending, imaging was reviewed with CT abdomen pelvis showing a large ventral hernia that is also consistent with physical exam, CT head shows no acute change, chest x-ray shows no acute change.  EKG shows known atrial fibrillation with a normal QTc orders in the ED were also reviewed  Assessment and Plan: * Acute metabolic encephalopathy- (present on admission) - Likely multifactorial with worsening nutritional status, worsening dementia, possible infection -UA is borderline, urine culture pending, continue Cipro - Continue to monitor electrolytes, check for nutritional deficiencies -CT head shows no acute changes With worsening dementia, patient will likely need placement  Elevated troponin- (present on admission) Troponin 70 Lateral leads ST depression which has been more subtly visible on previous EKGs Repeat troponin Continue to monitor on telemetry  Protein calorie malnutrition ([place])- (present on admission) Mild protein calorie malnutrition Encourage p.o. intake Continue to monitor  Fall at home, initial encounter PT eval and  treat Will likely need placement CT head shows no acute change Bruising over knee is old, current imaging not indicated If patient begins to localize pain consider more imaging  HTN (hypertension)- (present on admission) Continue metoprolol  Atrial fibrillation ([place]) [I48.91]- (present on admission) Continue Eliquis and metoprolol -with recurrent falls may consider discontinuing Eliquis at discharge       Advance Care Planning:   Code Status: Full Code   Consults: PT  Family Communication: No family at bedside at the time of my exam  Severity of Illness: The appropriate patient status for this patient is INPATIENT. Inpatient status is judged to be reasonable and necessary in order to provide the required intensity of service to ensure the patient's safety. The patient's presenting symptoms, physical exam  findings, and initial radiographic and laboratory data in the context of their chronic comorbidities is felt to place them at high risk for further clinical deterioration. Furthermore, it is not anticipated that the patient will be medically stable for discharge from the hospital within 2 midnights of admission.   * I certify that at the point of admission it is my clinical judgment that the patient will require inpatient hospital care spanning beyond 2 midnights from the point of admission due to high intensity of service, high risk for further deterioration and high frequency of surveillance required.*  Author: Rolla Plate, DO 05/19/2021 5:13 AM  For on call review [email or web address].

## 2021-05-19 NOTE — Hospital Course (Signed)
Per HPI: Tracy [name] is a 88 y.o. female with medical history significant of history of atrial fibrillation, CKD, dementia, hypertension, and more presents ED with a chief complaint of fall and worsening dementia.  Unfortunately patient is not able to provide any history at all.  At the time of my exam she is moving around in the bed and talking incoherently.  ED provider reports that he was able to reach family who stated that the patient's dementia is worsening.  She is also having increased number of falls in the past few days.  She fell once today, and fell once 4 days ago.  She has been taking Tylenol for the pain status post falls.  She also walked into the closet by accident today.  They also reported decrease in p.o. intake for 1 week.  She did not eat anything on the day of presentation.  ED provider also noted abdominal tenderness which is not noted on my exam.  She does have a massive ventral hernia on the left side of her abdomen.  No further history could be obtained at this time.   ED Temp 99, heart rate 78-110, respiratory rate 17-22, blood pressure 136/110-monitoring 39/90.,  Satting at 100% Patient does have slight leukocytosis 11.8 and thrombocytosis 405-likely due to acute phase reaction Chemistry panel is unremarkable Albumin 3.4, T. bili 1.4 Negative respiratory panel UA is borderline CT abdomen pelvis shows large ventral hernia with right gluteal hematoma CT head shows nothing acute Chest x-ray shows no acute abnormalities EKG shows heart rate 83, atrial fibrillation, normal QTc, some ST depression has been present on previous Patient was given Ativan, urine culture ordered, Cipro started (penicillin allergy), normal saline 500 mL bolus

## 2021-05-19 NOTE — Progress Notes (Signed)
Nutrition Follow up  Calorie count order received.   Nutrition services and nursing notified.  Full assessment and results to follow.  Royann Shivers MS,RD,CSG,LDN Contact: Loretha Stapler

## 2021-05-19 NOTE — Progress Notes (Signed)
PROGRESS NOTE    Patient: Tracy [name]                            PCP: Lyman Bishop, DO                    DOB: Sep 13, 1926            DOA: 05/18/2021 [id number]             DOS: 05/19/2021, 10:57 AM   LOS: 0 days   Date of Service: The patient was seen and examined on 05/19/2021  Subjective:   The patient was seen and examined this morning -visibly cachectic elderly female laying comfortably in bed Hemodynamically stable Patient is somnolent, not following any commands otherwise awake moving all 4 extremities -visible bruises in lower extremities  Brief Narrative:   Per HPI: Tracy [name] is a 88 y.o. female with medical history significant of history of atrial fibrillation, CKD, dementia, hypertension, and more presents ED with a chief complaint of fall and worsening dementia.  Unfortunately patient is not able to provide any history at all.  At the time of my exam she is moving around in the bed and talking incoherently.  ED provider reports that he was able to reach family who stated that the patient's dementia is worsening.  She is also having increased number of falls in the past few days.  She fell once today, and fell once 4 days ago.  She has been taking Tylenol for the pain status post falls.  She also walked into the closet by accident today.  They also reported decrease in p.o. intake for 1 week.  She did not eat anything on the day of presentation.  ED provider also noted abdominal tenderness which is not noted on my exam.  She does have a massive ventral hernia on the left side of her abdomen.  No further history could be obtained at this time.   ED Temp 99, heart rate 78-110, respiratory rate 17-22, blood pressure 136/110-monitoring 39/90.,  Satting at 100% Patient does have slight leukocytosis 11.8 and thrombocytosis 405-likely due to acute phase reaction Chemistry panel is unremarkable Albumin 3.4, T. bili 1.4 Negative respiratory panel UA is borderline CT  abdomen pelvis shows large ventral hernia with right gluteal hematoma CT head shows nothing acute Chest x-ray shows no acute abnormalities EKG shows heart rate 83, atrial fibrillation, normal QTc, some ST depression has been present on previous Patient was given Ativan, urine culture ordered, Cipro started (penicillin allergy), normal saline 500 mL bolus    Assessment & Plan:   Principal Problem:   Acute metabolic encephalopathy Active Problems:   Failure to thrive in adult   Atrial fibrillation (HCC) [I48.91]   HTN (hypertension)   Fall at home, initial encounter   Protein calorie malnutrition (HCC)   Elevated troponin     Assessment and Plan: * Acute metabolic encephalopathy- (present on admission) - Likely multifactorial with worsening nutritional status, worsening dementia, possible infection -UA is borderline, urine culture pending, continue Cipro - Continue to monitor electrolytes, check for nutritional deficiencies -CT head shows no acute changes With worsening dementia, patient will likely need placement  Failure to thrive in adult Body mass index is 20.99 kg/m.  -Likely exacerbated by poor p.o. intake due to dementia -With cachexia, consulting nutrition, encouraging nutrition supplements  Elevated troponin- (present on admission) Troponin 70 Lateral leads ST depression which has  been more subtly visible on previous EKGs Repeat troponin Continue to monitor on telemetry  Protein calorie malnutrition ([place])- (present on admission) Mild protein calorie malnutrition Encourage p.o. intake Continue to monitor -Dietitian will be consulted  Fall at home, initial encounter PT/OT to eval and treat -Based on today's evaluation patient needs placement, fall precaution, assist with all ADLs CT head shows no acute change Bruising over knee is old, current imaging not indicated If patient begins to localize pain consider more imaging  HTN (hypertension)- (present on  admission) Continue metoprolol -Elevated BP, as needed hydralazine  Atrial fibrillation ([place]) [I48.91]- (present on admission) Continue Eliquis and metoprolol -with recurrent falls may consider discontinuing Eliquis at discharge -Remained stable     --------------------------------------------------------------------------------------------------------- Nutritional status:  The patient's BMI is: Body mass index is 20.99 kg/m. I agree with the assessment and plan as outlined Nutrition Status:        Skin Assessment: I have examined the patient's skin and I agree with the wound assessment as performed by wound care team Negative for any open wounds, diffuse ecchymosis in lower extremities    ------------------------------------------------------------------------------------------------------------------------  DVT prophylaxis:  apixaban (ELIQUIS) tablet 2.5 mg Start: 05/19/21 0115 SCDs Start: 05/19/21 0114 apixaban (ELIQUIS) tablet 2.5 mg   Code Status:   Code Status: Full Code  Family Communication: No family member present at bedside- attempt will be made to update daily The above findings and plan of care has been discussed with patient (and family)  in detail,  they expressed understanding and agreement of above. -Advance care planning has been discussed.   Admission status:   Status is: Inpatient Remains inpatient appropriate because: Evaluation by PT OT, IV fluid hydration, evaluation for failure to thrive, malnutrition -cachexia             Procedures:   No admission procedures for hospital encounter.   Antimicrobials:  Anti-infectives (From admission, onward)    Start     Dose/Rate Route Frequency Ordered Stop   05/19/21 0900  ciprofloxacin (CIPRO) IVPB 400 mg        400 mg 200 mL/hr over 60 Minutes Intravenous Every 12 hours 05/19/21 0103     05/18/21 2345  ciprofloxacin (CIPRO) IVPB 400 mg        400 mg 200 mL/hr over 60 Minutes Intravenous   Once 05/18/21 2336 05/19/21 0159        Medication:   apixaban  2.5 mg Oral BID   feeding supplement  237 mL Oral BID BM   metoprolol tartrate  12.5 mg Oral BID   ziprasidone  20 mg Oral BID WC    acetaminophen **OR** acetaminophen, haloperidol lactate, ondansetron **OR** ondansetron (ZOFRAN) IV, oxyCODONE   Objective:   Vitals:   05/19/21 0400 05/19/21 0630 05/19/21 0817 05/19/21 0830  BP: (!) 137/117 129/65  (!) 161/65  Pulse: 88 80  86  Resp: 18 18  19   Temp:      TempSrc:      SpO2: 100% 100%  100%  Weight:   50.4 kg   Height:   5\' 1"  (1.549 m)    No intake or output data in the 24 hours ending 05/19/21 1057 Filed Weights   05/19/21 0817  Weight: 50.4 kg     Examination:   Physical Exam  Constitution: Confused awake -alert oriented x0, elderly cachectic female Psychiatric: Stable confused no signs of agitation HEENT:        Normocephalic, PERRL, otherwise with in Normal limits  Chest:  Chest symmetric Cardio vascular:  S1/S2, RRR, No murmure, No Rubs or Gallops  pulmonary: Clear to auscultation bilaterally, respirations unlabored, negative wheezes / crackles Abdomen: Soft, non-tender, non-distended, bowel sounds,no masses, no organomegaly Muscular skeletal: Limited exam - in bed, able to move all 4 extremities, severe global generalized weaknesses with cachexia Neuro: Limited exam-CNII-XII intact. , normal motor and sensation, reflexes intact  Extremities: No pitting edema lower extremities, +2 pulses  Skin: Dry, warm to touch, negative for any Rashes, lower extremity ecchymosis, bruises by the knee and lower shin area Wounds: per nursing documentation   ------------------------------------------------------------------------------------------------------------------------------------------    LABs:  CBC Latest Ref Rng & Units 05/19/2021 05/18/2021 08/01/2016  WBC 4.0 - 10.5 K/uL 11.7(H) 11.8(H) 5.8  Hemoglobin 12.0 - 15.0 g/dL 9.9(L) 9.8(L) 15.2   Hematocrit 36.0 - 46.0 % 33.7(L) 32.4(L) 46.8(H)  Platelets 150 - 400 K/uL 366 405(H) 293   CMP Latest Ref Rng & Units 05/19/2021 05/18/2021 11/11/2018  Glucose 70 - 99 mg/dL 99 99 87  BUN 8 - 23 mg/dL 25(H) 28(H) 23  Creatinine 0.44 - 1.00 mg/dL 1.08(H) 1.29(H) 1.51(H)  Sodium 135 - 145 mmol/L 135 139 140  Potassium 3.5 - 5.1 mmol/L 4.0 4.5 4.0  Chloride 98 - 111 mmol/L 105 108 100  CO2 22 - 32 mmol/L 22 24 28   Calcium 8.9 - 10.3 mg/dL 8.6(L) 8.8(L) 8.9  Total Protein 6.5 - 8.1 g/dL 6.5 6.4(L) -  Total Bilirubin 0.3 - 1.2 mg/dL 1.0 1.4(H) -  Alkaline Phos 38 - 126 U/L 71 71 -  AST 15 - 41 U/L 29 21 -  ALT 0 - 44 U/L 15 17 -       Micro Results Recent Results (from the past 240 hour(s))  Resp Panel by RT-PCR (Flu A&B, Covid) Nasopharyngeal Swab     Status: None   Collection Time: 05/18/21  7:38 PM   Specimen: Nasopharyngeal Swab; Nasopharyngeal(NP) swabs in vial transport medium  Result Value Ref Range Status   SARS Coronavirus 2 by RT PCR NEGATIVE NEGATIVE Final    Comment: (NOTE) SARS-CoV-2 target nucleic acids are NOT DETECTED.  The SARS-CoV-2 RNA is generally detectable in upper respiratory specimens during the acute phase of infection. The lowest concentration of SARS-CoV-2 viral copies this assay can detect is 138 copies/mL. A negative result does not preclude SARS-Cov-2 infection and should not be used as the sole basis for treatment or other patient management decisions. A negative result may occur with  improper specimen collection/handling, submission of specimen other than nasopharyngeal swab, presence of viral mutation(s) within the areas targeted by this assay, and inadequate number of viral copies(<138 copies/mL). A negative result must be combined with clinical observations, patient history, and epidemiological information. The expected result is Negative.  Fact Sheet for Patients:  EntrepreneurPulse.com.au  Fact Sheet for Healthcare  Providers:  IncredibleEmployment.be  This test is no t yet approved or cleared by the [place] FDA and  has been authorized for detection and/or diagnosis of SARS-CoV-2 by FDA under an Emergency Use Authorization (EUA). This EUA will remain  in effect (meaning this test can be used) for the duration of the COVID-19 declaration under Section 564(b)(1) of the Act, 21 U.S.C.section 360bbb-3(b)(1), unless the authorization is terminated  or revoked sooner.       Influenza A by PCR NEGATIVE NEGATIVE Final   Influenza B by PCR NEGATIVE NEGATIVE Final    Comment: (NOTE) The Xpert Xpress SARS-CoV-2/FLU/RSV plus assay is intended as an aid in the diagnosis  of influenza from Nasopharyngeal swab specimens and should not be used as a sole basis for treatment. Nasal washings and aspirates are unacceptable for Xpert Xpress SARS-CoV-2/FLU/RSV testing.  Fact Sheet for Patients: EntrepreneurPulse.com.au  Fact Sheet for Healthcare Providers: IncredibleEmployment.be  This test is not yet approved or cleared by the [place] FDA and has been authorized for detection and/or diagnosis of SARS-CoV-2 by FDA under an Emergency Use Authorization (EUA). This EUA will remain in effect (meaning this test can be used) for the duration of the COVID-19 declaration under Section 564(b)(1) of the Act, 21 U.S.C. section 360bbb-3(b)(1), unless the authorization is terminated or revoked.  Performed at [facility], [address], [place], [place] [address]     Radiology Reports DG Chest 1 View  Result Date: 05/18/2021 CLINICAL DATA:  Multiple falls. EXAM: CHEST  1 VIEW COMPARISON:  Chest x-ray 04/03/2013 FINDINGS: Heart is enlarged, unchanged. There is stable prominence of the right paratracheal region. There is some scarring in the lung apices. The lungs are otherwise clear. There is no pleural effusion or pneumothorax. No acute fractures are seen.  IMPRESSION: 1. Stable cardiomegaly. 2. No acute cardiopulmonary process. Electronically Signed   By: Ronney Asters M.D.   On: 05/18/2021 22:27   CT Head Wo Contrast  Result Date: 05/18/2021 CLINICAL DATA:  Altered level of consciousness, multiple falls EXAM: CT HEAD WITHOUT CONTRAST TECHNIQUE: Contiguous axial images were obtained from the base of the skull through the vertex without intravenous contrast. RADIATION DOSE REDUCTION: This exam was performed according to the departmental dose-optimization program which includes automated exposure control, adjustment of the mA and/or kV according to patient size and/or use of iterative reconstruction technique. COMPARISON:  10/14/2015 FINDINGS: Brain: Evaluation is slightly limited by patient motion during the exam. Chronic right basal ganglia lacunar infarct again noted. There are chronic small-vessel ischemic changes throughout the periventricular white matter. No evidence of acute infarct or hemorrhage. The lateral ventricles and remaining midline structures are otherwise unremarkable. No acute extra-axial fluid collections. No mass effect. Vascular: No hyperdense vessel or unexpected calcification. Skull: Normal. Negative for fracture or focal lesion. Sinuses/Orbits: No acute finding. Other: None. IMPRESSION: 1. Stable head CT, no acute intracranial process. Electronically Signed   By: Randa Ngo M.D.   On: 05/18/2021 22:39   CT Abdomen Pelvis W Contrast  Result Date: 05/18/2021 CLINICAL DATA:  Evaluate for bowel obstruction.  Multiple falls. EXAM: CT ABDOMEN AND PELVIS WITH CONTRAST TECHNIQUE: Multidetector CT imaging of the abdomen and pelvis was performed using the standard protocol following bolus administration of intravenous contrast. RADIATION DOSE REDUCTION: This exam was performed according to the departmental dose-optimization program which includes automated exposure control, adjustment of the mA and/or kV according to patient size and/or use of  iterative reconstruction technique. CONTRAST:  39mL OMNIPAQUE IOHEXOL 300 MG/ML  SOLN COMPARISON:  None. FINDINGS: Lower chest: The heart is enlarged.  Lung bases are grossly clear. Hepatobiliary: There is a homogeneously enhancing lesion in the left lobe of the liver measuring 1.4 x 1.9 cm. This is isodense to the liver on delayed imaging. No other liver lesions are identified. Gallbladder is within normal limits. No biliary ductal dilatation. Pancreas: Limited evaluation secondary to motion artifact. No definite acute or focal abnormality. Spleen: Normal in size without focal abnormality. Adrenals/Urinary Tract: Limited evaluation secondary to motion artifact. No hydronephrosis or perinephric fluid. Adrenal glands and bladder are within normal limits. Stomach/Bowel: No dilated bowel loops identified. There is colonic diverticulosis without evidence for diverticulitis. The  appendix is not seen. The stomach is within normal limits. There is a large amount of stool dilating the rectum. Rectum measures 7.4 cm in diameter. Vascular/Lymphatic: Aortic atherosclerosis. No enlarged abdominal or pelvic lymph nodes. Reproductive: There is a 1.7 cm right adnexal cyst. Left ovary not well seen. Uterus appears atrophic. Other: There is no ascites or free air. There is a large ventral hernia containing portions of the stomach, pancreas, large bowel and small bowel. Wall defect measures 4.8 by 6.3 cm. Hernia sac is left of midline on this study measuring 14.5 x 7 by 14.8 cm. There is no edema in the hernia sac. Musculoskeletal: Bones are diffusely osteopenic. Multilevel degenerative changes affect the spine. No acute fractures are seen. There is intramuscular hyperdensity in surrounding subcutaneous stranding in the inferior right gluteus muscle, incompletely imaged. IMPRESSION: 1. Large ventral hernia containing colon, small bowel, pancreas and stomach. No evidence for bowel obstruction. 2. Right gluteal musculature hyperdensity  with surrounding edema may represent hematoma. Please correlate clinically. Differential diagnosis would include infection or other soft tissue mass. 3. The rectum is dilated and stool-filled. 4. Sigmoid colon diverticulosis. 5. 1.7 cm right adnexal simple cysts. Note: This recommendation does not apply to premenarchal patients and to those with increased risk (genetic, family history, elevated tumor markers or other high-risk factors) of ovarian cancer. Reference: JACR 2020 Feb; 17(2):248-254 6. Enhancing liver lesion measuring 1.9 cm. Findings may represent a flash fill hemangioma, but are indeterminate. Recommend further characterization with MRI. 7.  Aortic Atherosclerosis (ICD10-I70.0). Electronically Signed   By: Ronney Asters M.D.   On: 05/18/2021 22:40    SIGNED: Deatra James, MD, FHM. [facility],  Pager (please use [email or web address] to page/text) Please use Epic Secure Chat for non-urgent communication (7AM-7PM)  If 7PM-7AM, please contact night-coverage [email or web address], 05/19/2021, 10:57 AM

## 2021-05-19 NOTE — Assessment & Plan Note (Addendum)
-   On metoprolol, mildly bradycardic, heart rate 47 -We will hold metoprolol -As needed IV hydralazine will be utilized

## 2021-05-19 NOTE — Assessment & Plan Note (Addendum)
-  In the setting of end-stage dementia - Likely multifactorial with worsening nutritional status, worsening dementia, possible infection -Continue treating urinary tract infection - Continue to monitor electrolytes, check for nutritional deficiencies -CT head shows no acute changes -Discussed with the patient's POA son Mr. Tracy [name] Who confirms the patient dementia is worse but she was reactive and communicating with him After extensive discussion over the phone on 05/20/2021 I have asked Mr. Volk to discuss his mother's current condition with his sisters as we feel patient prognosis is poor We recommend patients to be DNR/DNI  If she continues to decline palliative care hospice is recommended

## 2021-05-19 NOTE — Assessment & Plan Note (Addendum)
Continue Eliquis and metoprolol -with recurrent falls may consider discontinuing Eliquis at discharge -Remained encephalopathic, will try oral medication today again -As the risks outweighs the benefits, would not recommend for heparin drip at this time -Currently stable monitor

## 2021-05-19 NOTE — Progress Notes (Signed)
EEG complete - results pending 

## 2021-05-19 NOTE — Assessment & Plan Note (Addendum)
Mild protein calorie malnutrition Encourage p.o. intake Continue to monitor -Dietitian will be consulted -Poor p.o. intake due to reduced mentation

## 2021-05-19 NOTE — Assessment & Plan Note (Addendum)
PT/OT -recommendation-placement -Based on today's evaluation patient needs placement, fall precaution, assist with all ADLs CT head shows no acute change Bruising over knee is old, current imaging not indicated If patient begins to localize pain consider more imaging

## 2021-05-20 DIAGNOSIS — G9341 Metabolic encephalopathy: Secondary | ICD-10-CM | POA: Diagnosis not present

## 2021-05-20 LAB — BASIC METABOLIC PANEL
Anion gap: 8 (ref 5–15)
Anion gap: 9 (ref 5–15)
BUN: 22 mg/dL (ref 8–23)
BUN: 22 mg/dL (ref 8–23)
CO2: 23 mmol/L (ref 22–32)
CO2: 22 mmol/L (ref 22–32)
Calcium: 8.5 mg/dL — ABNORMAL LOW (ref 8.9–10.3)
Calcium: 8.6 mg/dL — ABNORMAL LOW (ref 8.9–10.3)
Chloride: 106 mmol/L (ref 98–111)
Chloride: 106 mmol/L (ref 98–111)
Creatinine, Ser: 1.08 mg/dL — ABNORMAL HIGH (ref 0.44–1.00)
Creatinine, Ser: 1.16 mg/dL — ABNORMAL HIGH (ref 0.44–1.00)
GFR, Estimated: 48 mL/min — ABNORMAL LOW (ref >60)
GFR, Estimated: 44 mL/min — ABNORMAL LOW (ref >60)
Glucose, Bld: 91 mg/dL (ref 70–99)
Glucose, Bld: 88 mg/dL (ref 70–99)
Potassium: 4.2 mmol/L (ref 3.5–5.1)
Potassium: 4.4 mmol/L (ref 3.5–5.1)
Sodium: 137 mmol/L (ref 135–145)
Sodium: 137 mmol/L (ref 135–145)

## 2021-05-20 LAB — CBC
HCT: 31.3 % — ABNORMAL LOW (ref 36.0–46.0)
Hemoglobin: 9 g/dL — ABNORMAL LOW (ref 12.0–15.0)
MCH: 19.8 pg — ABNORMAL LOW (ref 26.0–34.0)
MCHC: 28.8 g/dL — ABNORMAL LOW (ref 30.0–36.0)
MCV: 68.9 fL — ABNORMAL LOW (ref 80.0–100.0)
Platelets: 395 10*3/uL (ref 150–400)
RBC: 4.54 MIL/uL (ref 3.87–5.11)
RDW: 20.2 % — ABNORMAL HIGH (ref 11.5–15.5)
WBC: 11.4 10*3/uL — ABNORMAL HIGH (ref 4.0–10.5)
nRBC: 0 % (ref 0.0–0.2)

## 2021-05-20 LAB — AMMONIA: Ammonia: 16 umol/L (ref 9–35)

## 2021-05-20 LAB — T3, FREE: T3, Free: 2.7 pg/mL (ref 2.0–4.4)

## 2021-05-20 MED ORDER — CIPROFLOXACIN IN D5W 400 MG/200ML IV SOLN
400.0000 mg | INTRAVENOUS | Status: DC
  Administered 2021-05-21 – 2021-05-23 (×3): 400 mg via INTRAVENOUS
  Filled 2021-05-20 (×3): qty 200

## 2021-05-20 MED ORDER — MEGESTROL ACETATE 400 MG/10ML PO SUSP
400.0000 mg | Freq: Every day | ORAL | Status: DC
  Filled 2021-05-20 (×2): qty 10

## 2021-05-20 MED ORDER — DEXTROSE-NACL 5-0.45 % IV SOLN: INTRAVENOUS | Status: DC

## 2021-05-20 MED ORDER — ENSURE ENLIVE PO LIQD
237.0000 mL | Freq: Three times a day (TID) | ORAL | Status: DC
  Administered 2021-05-22: 237 mL via ORAL

## 2021-05-20 MED ORDER — QUETIAPINE FUMARATE 25 MG PO TABS: 25.0000 mg | ORAL_TABLET | Freq: Two times a day (BID) | ORAL | Status: DC

## 2021-05-20 NOTE — Progress Notes (Signed)
PROGRESS NOTE    Patient: Tracy [name]                            PCP: Lyman Bishop, DO                    DOB: October 30, 1926            DOA: 05/18/2021 [id number]             DOS: 05/20/2021, 10:18 AM   LOS: 1 day   Date of Service: The patient was seen and examined on 05/20/2021  Subjective:   The patient was seen and examined this morning. Hemodynamically stable, altered mental status, confused, not following any verbal commands... Responds only to pain stimuli  Brief Narrative:   Per HPI: Tracy [name] is a 88 y.o. female with medical history significant of history of atrial fibrillation, CKD, dementia, hypertension, and more presents ED with a chief complaint of fall and worsening dementia.  Unfortunately patient is not able to provide any history at all.  At the time of my exam she is moving around in the bed and talking incoherently.  ED provider reports that he was able to reach family who stated that the patient's dementia is worsening.  She is also having increased number of falls in the past few days.  She fell once today, and fell once 4 days ago.  She has been taking Tylenol for the pain status post falls.  She also walked into the closet by accident today.  They also reported decrease in p.o. intake for 1 week.  She did not eat anything on the day of presentation.  ED provider also noted abdominal tenderness which is not noted on my exam.  She does have a massive ventral hernia on the left side of her abdomen.  No further history could be obtained at this time.   ED Temp 99, heart rate 78-110, respiratory rate 17-22, blood pressure 136/110-monitoring 39/90.,  Satting at 100% Patient does have slight leukocytosis 11.8 and thrombocytosis 405-likely due to acute phase reaction Chemistry panel is unremarkable Albumin 3.4, T. bili 1.4 Negative respiratory panel UA is borderline CT abdomen pelvis shows large ventral hernia with right gluteal hematoma CT head shows  nothing acute Chest x-ray shows no acute abnormalities EKG shows heart rate 83, atrial fibrillation, normal QTc, some ST depression has been present on previous Patient was given Ativan, urine culture ordered, Cipro started (penicillin allergy), normal saline 500 mL bolus    Assessment & Plan:   Principal Problem:   Acute metabolic encephalopathy Active Problems:   Failure to thrive in adult   Atrial fibrillation (HCC) [I48.91]   HTN (hypertension)   Fall at home, initial encounter   Protein calorie malnutrition (HCC)   Elevated troponin     Assessment and Plan: * Acute metabolic encephalopathy- (present on admission) -In the setting of end-stage dementia - Likely multifactorial with worsening nutritional status, worsening dementia, possible infection -UA is borderline, urine culture pending, continue Cipro - Continue to monitor electrolytes, check for nutritional deficiencies -CT head shows no acute changes -Discussed with the patient's POA son Mr. Juliah Gradillas Who confirms the patient dementia is worse but she was reactive and communicating with him After extensive discussion over the phone on 05/20/2021 I have asked Mr. Stuhl to discuss his mother's current condition with his sisters as we feel patient prognosis is poor We recommend patients to  be DNR/DNI  If she continues to decline palliative care hospice is recommended  Failure to thrive in adult Body mass index is 20.99 kg/m.  -Likely exacerbated by poor p.o. intake due to dementia -With cachexia, consulting nutrition, encouraging nutrition supplements -Patient continues to have very poor oral intake gentle IV fluid hydration has been initiated to the patient's family make the final decision regarding treatment plan and goals of care  Atrial fibrillation ([place]) [I48.91]- (present on admission) Continue Eliquis and metoprolol -with recurrent falls may consider discontinuing Eliquis at discharge -Remained stable -As  patient remain confused, encephalopathic, oral medication including Eliquis is on hold -As the risks outweighs the benefits, would not recommend for heparin drip at this time  Elevated troponin- (present on admission) Troponin 70 Lateral leads ST depression which has been more subtly visible on previous EKGs Repeat troponin Continue to monitor on telemetry  Protein calorie malnutrition ([place])- (present on admission) Mild protein calorie malnutrition Encourage p.o. intake Continue to monitor -Dietitian will be consulted  Fall at home, initial encounter PT/OT to eval and treat -Based on today's evaluation patient needs placement, fall precaution, assist with all ADLs CT head shows no acute change Bruising over knee is old, current imaging not indicated If patient begins to localize pain consider more imaging  HTN (hypertension)- (present on admission) Continue metoprolol -Elevated BP, as needed hydralazine    ----------------------------------------------------------------------------------------------------------------------------------- Nutritional status:  The patient's BMI is: Body mass index is 18.2 kg/m. I agree with the assessment and plan as outlined below: Nutrition Status: Nutrition Problem: Moderate Malnutrition Etiology: chronic illness (dementia) Signs/Symptoms: per patient/family report, mild fat depletion, mild muscle depletion, moderate muscle depletion, moderate fat depletion, energy intake < or equal to 50% for > or equal to 5 days (BMI 18.2) Interventions: Ensure Enlive (each supplement provides 350kcal and 20 grams of protein)   Cultures; Urine Culture  >>> NGT     DVT prophylaxis:  apixaban (ELIQUIS) tablet 2.5 mg Start: 05/19/21 0115 SCDs Start: 05/19/21 0114 apixaban (ELIQUIS) tablet 2.5 mg   Code Status:   Code Status: Full Code  Family Communication:  Patient's son Mr. Keeona Liebelt was called on 05/20/2021... He was updated, We have  expressed our concern regarding CODE STATUS, and patient steady physical and mental decline...  Prognosis related to poor recommended DNR/DNI status-palliative versus hospice He is discussed with his sibling and get back to [place] later today or tomorrow  The above findings and plan of care has been discussed with her son  in detail, they expressed understanding and agreement of above. -Advance care planning has been discussed.   Admission status:   Status is: Inpatient Remains inpatient appropriate because: Needing IV fluids, IV antibiotics, worsening mental status change-continue discussion with family regarding goals of care and treatment plan    Disposition: From home,  To be determined -  likely home with palliative care versus hospice in 2-3 days         Procedures:   No admission procedures for hospital encounter.   Antimicrobials:  Anti-infectives (From admission, onward)    Start     Dose/Rate Route Frequency Ordered Stop   05/19/21 0900  ciprofloxacin (CIPRO) IVPB 400 mg        400 mg 200 mL/hr over 60 Minutes Intravenous Every 12 hours 05/19/21 0103     05/18/21 2345  ciprofloxacin (CIPRO) IVPB 400 mg        400 mg 200 mL/hr over 60 Minutes Intravenous  Once 05/18/21 2336 05/19/21 0159  Medication:   apixaban  2.5 mg Oral BID   Chlorhexidine Gluconate Cloth  6 each Topical Daily   feeding supplement  237 mL Oral TID BM   metoprolol tartrate  12.5 mg Oral BID   QUEtiapine  25 mg Oral BID    acetaminophen **OR** acetaminophen, haloperidol lactate, ondansetron **OR** ondansetron (ZOFRAN) IV, oxyCODONE   Objective:   Vitals:   05/20/21 0300 05/20/21 0400 05/20/21 0500 05/20/21 0823  BP:  (!) 154/49  (!) 158/71  Pulse: 76 80    Resp: 17 16    Temp:   98.1 F (36.7 C)   TempSrc:      SpO2: 100% 100%    Weight:   48.1 kg   Height:        Intake/Output Summary (Last 24 hours) at 05/20/2021 1018 Last data filed at 05/20/2021 0500 Gross per 24  hour  Intake 413.54 ml  Output 100 ml  Net 313.54 ml   Filed Weights   05/19/21 0817 05/19/21 1242 05/20/21 0500  Weight: 50.4 kg 49.1 kg 48.1 kg     Examination:   Physical Exam  Constitution: Altered mental status, not responding to verbal stimuli only pain stimuli Psychiatric:   Severely demented, HEENT:        Poor vision -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  normocephalic, otherwise with in Normal limits  Chest:         Chest symmetric Cardio vascular:  S1/S2, RRR, No murmure, No Rubs or Gallops  pulmonary: Clear to auscultation bilaterally, respirations unlabored, negative wheezes / crackles Abdomen: Soft, non-tender, non-distended, bowel sounds,no masses, no organomegaly Muscular skeletal: Limited exam -moving all 4 extremities with pain stimuli Neuro: Limited exam-patient is somnolent Extremities: No pitting edema lower extremities, +2 pulses  Skin: Dry, warm to touch, negative for any Rashes, No open wounds Wounds: per nursing documentation   ------------------------------------------------------------------------------------------------------------------------------------------    LABs:  CBC Latest Ref Rng & Units 05/20/2021 05/19/2021 05/18/2021  WBC 4.0 - 10.5 K/uL 11.4(H) 11.7(H) 11.8(H)  Hemoglobin 12.0 - 15.0 g/dL 9.0(L) 9.9(L) 9.8(L)  Hematocrit 36.0 - 46.0 % 31.3(L) 33.7(L) 32.4(L)  Platelets 150 - 400 K/uL 395 366 405(H)   CMP Latest Ref Rng & Units 05/20/2021 05/20/2021 05/19/2021  Glucose 70 - 99 mg/dL 88 91 99  BUN 8 - 23 mg/dL 22 22 25(H)  Creatinine 0.44 - 1.00 mg/dL 1.16(H) 1.08(H) 1.08(H)  Sodium 135 - 145 mmol/L 137 137 135  Potassium 3.5 - 5.1 mmol/L 4.4 4.2 4.0  Chloride 98 - 111 mmol/L 106 106 105  CO2 22 - 32 mmol/L 22 23 22   Calcium 8.9 - 10.3 mg/dL 8.6(L) 8.5(L) 8.6(L)  Total Protein 6.5 - 8.1 g/dL - - 6.5  Total Bilirubin 0.3 - 1.2 mg/dL - - 1.0  Alkaline Phos 38 - 126 U/L - - 71  AST 15 - 41 U/L - - 29  ALT 0 - 44 U/L - - 15        Micro Results Recent Results (from the past 240 hour(s))  Resp Panel by RT-PCR (Flu A&B, Covid) Nasopharyngeal Swab     Status: None   Collection Time: 05/18/21  7:38 PM   Specimen: Nasopharyngeal Swab; Nasopharyngeal(NP) swabs in vial transport medium  Result Value Ref Range Status   SARS Coronavirus 2 by RT PCR NEGATIVE NEGATIVE Final    Comment: (NOTE) SARS-CoV-2 target nucleic acids are NOT DETECTED.  The SARS-CoV-2 RNA is generally detectable in upper respiratory specimens during the acute phase of infection.  The lowest concentration of SARS-CoV-2 viral copies this assay can detect is 138 copies/mL. A negative result does not preclude SARS-Cov-2 infection and should not be used as the sole basis for treatment or other patient management decisions. A negative result may occur with  improper specimen collection/handling, submission of specimen other than nasopharyngeal swab, presence of viral mutation(s) within the areas targeted by this assay, and inadequate number of viral copies(<138 copies/mL). A negative result must be combined with clinical observations, patient history, and epidemiological information. The expected result is Negative.  Fact Sheet for Patients:  EntrepreneurPulse.com.au  Fact Sheet for Healthcare Providers:  IncredibleEmployment.be  This test is no t yet approved or cleared by the [place] FDA and  has been authorized for detection and/or diagnosis of SARS-CoV-2 by FDA under an Emergency Use Authorization (EUA). This EUA will remain  in effect (meaning this test can be used) for the duration of the COVID-19 declaration under Section 564(b)(1) of the Act, 21 U.S.C.section 360bbb-3(b)(1), unless the authorization is terminated  or revoked sooner.       Influenza A by PCR NEGATIVE NEGATIVE Final   Influenza B by PCR NEGATIVE NEGATIVE Final    Comment: (NOTE) The Xpert Xpress SARS-CoV-2/FLU/RSV plus  assay is intended as an aid in the diagnosis of influenza from Nasopharyngeal swab specimens and should not be used as a sole basis for treatment. Nasal washings and aspirates are unacceptable for Xpert Xpress SARS-CoV-2/FLU/RSV testing.  Fact Sheet for Patients: EntrepreneurPulse.com.au  Fact Sheet for Healthcare Providers: IncredibleEmployment.be  This test is not yet approved or cleared by the [place] FDA and has been authorized for detection and/or diagnosis of SARS-CoV-2 by FDA under an Emergency Use Authorization (EUA). This EUA will remain in effect (meaning this test can be used) for the duration of the COVID-19 declaration under Section 564(b)(1) of the Act, 21 U.S.C. section 360bbb-3(b)(1), unless the authorization is terminated or revoked.  Performed at [facility], [address] [place], [place] [address]   Urine Culture     Status: None (Preliminary result)   Collection Time: 05/18/21  8:07 PM   Specimen: Urine, Catheterized  Result Value Ref Range Status   Specimen Description   Final    URINE, CATHETERIZED Performed at [facility], [address] [place], [place] [address]    Special Requests   Final    NONE Performed at [facility] [address] [place], [place] [address]    Culture   Final    CULTURE REINCUBATED FOR BETTER GROWTH Performed at [facility], [employer] [address], [place], [place] [address]    Report Status PENDING  Incomplete  MRSA Next Gen by PCR, Nasal     Status: None   Collection Time: 05/19/21 12:30 PM   Specimen: Nasal Mucosa; Nasal Swab  Result Value Ref Range Status   MRSA by PCR Next Gen NOT DETECTED NOT DETECTED Final    Comment: (NOTE) The GeneXpert MRSA Assay (FDA approved for NASAL specimens only), is one component of a comprehensive MRSA colonization surveillance program. It is not intended to diagnose MRSA infection nor to guide or monitor treatment for MRSA infections. Test  performance is not FDA approved in patients less than 65 years old. Performed at [facility], [address], [place], [place] [address]     Radiology Reports EEG adult  Result Date: 05/19/2021 Lora Havens, MD     05/19/2021 12:28 PM Patient Name: CAPRI HUPPERT MRN: [id number] Epilepsy Attending: Marcelle Overlie  Barbra Sarks Referring Physician/Provider: Rolla Plate, DO Date: 05/19/2021 Duration: 23.45 mins Patient history: 88yo F with ams. EEG to evaluate for seizure Level of alertness: Awake, asleep AEDs during EEG study: None Technical aspects: This EEG study was done with scalp electrodes positioned according to the 10-20 International system of electrode placement. Electrical activity was acquired at a sampling rate of 500Hz  and reviewed with a high frequency filter of 70Hz  and a low frequency filter of 1Hz . EEG data were recorded continuously and digitally stored. Description: The posterior dominant rhythm consists of 10 Hz activity of moderate voltage (25-35 uV) seen predominantly in posterior head regions, symmetric and reactive to eye opening and eye closing. Sleep was characterized by vertex waves, sleep spindles (12 to 14 Hz), maximal frontocentral region. Hyperventilation did not show any EEG change.  Physiologic photic driving was not seen during photic stimulation.  IMPRESSION: This study is within normal limits. No seizures or epileptiform discharges were seen throughout the recording. Lora Havens    SIGNED: Deatra James, MD, FHM. [facility],  Pager (please use [email or web address] to page/text) Please use Epic Secure Chat for non-urgent communication (7AM-7PM)  If 7PM-7AM, please contact night-coverage [email or web address], 05/20/2021, 10:18 AM

## 2021-05-20 NOTE — Progress Notes (Signed)
Initial Nutrition Assessment  DOCUMENTATION CODES:   Non-severe (moderate) malnutrition in context of chronic illness  INTERVENTION:  Calorie count discontinued per MD  If patient becomes altert for po's encourage meals and assist as needed  Encourage ONS TID   NUTRITION DIAGNOSIS:   Moderate Malnutrition related to chronic illness (dementia) as evidenced by per patient/family report, mild fat depletion, mild muscle depletion, moderate muscle depletion, moderate fat depletion, energy intake < or equal to 50% for > or equal to 5 days (BMI 18.2).   GOAL:   (per outcome of Palliative GOC discussion with son)   MONITOR:  Labs, Skin, Supplement acceptance, PO intake  REASON FOR ASSESSMENT:   Consult Calorie Count (poor po)  ASSESSMENT: Patient is an underweight 88 yo female with CKD, Dementia, HTN. Presents with multiple falls and cognitive decline. Failure to thrive, malnutrition, acute metabolic encephalopathy.  No family bedside. MD with patient during RD visit. Calorie count discontinued. Patient not taking po's.   Patient has not been alert for po intake since admission. Palliative has been consulted but unable to see patient until 2/6.   Weight history reviewed.   Labs:   BMP Latest Ref Rng & Units 05/20/2021 05/20/2021 05/19/2021  Glucose 70 - 99 mg/dL 88 91 99  BUN 8 - 23 mg/dL 22 22 25(H)  Creatinine 0.44 - 1.00 mg/dL 1.16(H) 1.08(H) 1.08(H)  BUN/Creat Ratio 6 - 22 (calc) - - -  Sodium 135 - 145 mmol/L 137 137 135  Potassium 3.5 - 5.1 mmol/L 4.4 4.2 4.0  Chloride 98 - 111 mmol/L 106 106 105  CO2 22 - 32 mmol/L 22 23 22   Calcium 8.9 - 10.3 mg/dL 8.6(L) 8.5(L) 8.6(L)     NUTRITION - FOCUSED PHYSICAL EXAM:  Flowsheet Row Most Recent Value  Orbital Region Moderate depletion  Upper Arm Region Mild depletion  Thoracic and Lumbar Region Moderate depletion  Buccal Region Severe depletion  Temple Region Moderate depletion  Clavicle Bone Region Moderate depletion   Clavicle and Acromion Bone Region Severe depletion  Scapular Bone Region Mild depletion  Dorsal Hand Severe depletion  Patellar Region Moderate depletion  Anterior Thigh Region Moderate depletion  Edema (RD Assessment) Mild  Hair Reviewed  Eyes Unable to assess  Mouth Reviewed  Skin Reviewed  Nails Reviewed      Diet Order:   Diet Order             Diet Heart Room service appropriate? Yes; Fluid consistency: Thin  Diet effective now                   EDUCATION NEEDS:  Not appropriate for education at this time  Skin:  Skin Assessment: Reviewed RN Assessment  Last BM:  2/3 medium  Height:   Ht Readings from Last 1 Encounters:  05/19/21 5\' 4"  (1.626 m)    Weight:   Wt Readings from Last 1 Encounters:  05/20/21 48.1 kg    Ideal Body Weight:   55 kg  BMI:  Body mass index is 18.2 kg/m.  Estimated Nutritional Needs:   Kcal:  [id number]  Protein:  69-75 gr  Fluid:  1400 ml daily  Colman Cater MS,RD,CSG,LDN Contact: Shea Evans

## 2021-05-20 NOTE — Plan of Care (Signed)
  Problem: Education: Goal: Knowledge of General Education information will improve Description: Including pain rating scale, medication(s)/side effects and non-pharmacologic comfort measures Outcome: Not Progressing   

## 2021-05-21 DIAGNOSIS — G9341 Metabolic encephalopathy: Secondary | ICD-10-CM | POA: Diagnosis not present

## 2021-05-21 DIAGNOSIS — N390 Urinary tract infection, site not specified: Secondary | ICD-10-CM | POA: Diagnosis present

## 2021-05-21 DIAGNOSIS — E440 Moderate protein-calorie malnutrition: Secondary | ICD-10-CM | POA: Insufficient documentation

## 2021-05-21 LAB — CBC
HCT: 29.5 % — ABNORMAL LOW (ref 36.0–46.0)
Hemoglobin: 8.6 g/dL — ABNORMAL LOW (ref 12.0–15.0)
MCH: 20.5 pg — ABNORMAL LOW (ref 26.0–34.0)
MCHC: 29.2 g/dL — ABNORMAL LOW (ref 30.0–36.0)
MCV: 70.2 fL — ABNORMAL LOW (ref 80.0–100.0)
Platelets: 416 10*3/uL — ABNORMAL HIGH (ref 150–400)
RBC: 4.2 MIL/uL (ref 3.87–5.11)
RDW: 20.4 % — ABNORMAL HIGH (ref 11.5–15.5)
WBC: 11.9 10*3/uL — ABNORMAL HIGH (ref 4.0–10.5)
nRBC: 0 % (ref 0.0–0.2)

## 2021-05-21 LAB — BASIC METABOLIC PANEL
Anion gap: 6 (ref 5–15)
BUN: 20 mg/dL (ref 8–23)
CO2: 24 mmol/L (ref 22–32)
Calcium: 8.2 mg/dL — ABNORMAL LOW (ref 8.9–10.3)
Chloride: 105 mmol/L (ref 98–111)
Creatinine, Ser: 1.17 mg/dL — ABNORMAL HIGH (ref 0.44–1.00)
GFR, Estimated: 44 mL/min — ABNORMAL LOW (ref >60)
Glucose, Bld: 104 mg/dL — ABNORMAL HIGH (ref 70–99)
Potassium: 3.4 mmol/L — ABNORMAL LOW (ref 3.5–5.1)
Sodium: 135 mmol/L (ref 135–145)

## 2021-05-21 LAB — URINE CULTURE: Culture: >=100000 — AB

## 2021-05-21 MED ORDER — POTASSIUM CHLORIDE 10 MEQ/100ML IV SOLN
10.0000 meq | INTRAVENOUS | Status: AC
  Administered 2021-05-21 (×2): 10 meq via INTRAVENOUS
  Filled 2021-05-21 (×2): qty 100

## 2021-05-21 NOTE — Progress Notes (Signed)
PROGRESS NOTE    Patient: Tracy [name]                            PCP: Lyman Bishop, DO                    DOB: July 01, 1926            DOA: 05/18/2021 [id number]             DOS: 05/21/2021, 11:19 AM   LOS: 2 days   Date of Service: The patient was seen and examined on 05/21/2021  Subjective:   The patient was seen and examined, does not respond to verbal stimuli, withdraws to pain stimuli Hemodynamically stable, per nursing staff patient does wake up and interact at times  Brief Narrative:   Per HPI: Tracy [name] is a 88 y.o. female with medical history significant of history of atrial fibrillation, CKD, dementia, hypertension, and more presents ED with a chief complaint of fall and worsening dementia.  Unfortunately patient is not able to provide any history at all.  At the time of my exam she is moving around in the bed and talking incoherently.  ED provider reports that he was able to reach family who stated that the patient's dementia is worsening.  She is also having increased number of falls in the past few days.  She fell once today, and fell once 4 days ago.  She has been taking Tylenol for the pain status post falls.  She also walked into the closet by accident today.  They also reported decrease in p.o. intake for 1 week.  She did not eat anything on the day of presentation.  ED provider also noted abdominal tenderness which is not noted on my exam.  She does have a massive ventral hernia on the left side of her abdomen.  No further history could be obtained at this time.   ED Temp 99, heart rate 78-110, respiratory rate 17-22, blood pressure 136/110-monitoring 39/90.,  Satting at 100% Patient does have slight leukocytosis 11.8 and thrombocytosis 405-likely due to acute phase reaction Chemistry panel is unremarkable Albumin 3.4, T. bili 1.4 Negative respiratory panel UA is borderline CT abdomen pelvis shows large ventral hernia with right gluteal hematoma CT head  shows nothing acute Chest x-ray shows no acute abnormalities EKG shows heart rate 83, atrial fibrillation, normal QTc, some ST depression has been present on previous Patient was given Ativan, urine culture ordered, Cipro started (penicillin allergy), normal saline 500 mL bolus    Assessment & Plan:   Principal Problem:   Acute metabolic encephalopathy Active Problems:   UTI (urinary tract infection)   Failure to thrive in adult   Atrial fibrillation (HCC) [I48.91]   HTN (hypertension)   Fall at home, initial encounter   Protein calorie malnutrition (HCC)   Elevated troponin   Malnutrition of moderate degree     Assessment and Plan: * Acute metabolic encephalopathy- (present on admission) -In the setting of end-stage dementia - Likely multifactorial with worsening nutritional status, worsening dementia, possible infection -Continue treating urinary tract infection - Continue to monitor electrolytes, check for nutritional deficiencies -CT head shows no acute changes -Discussed with the patient's POA son Mr. Markaila Pecha Who confirms the patient dementia is worse but she was reactive and communicating with him After extensive discussion over the phone on 05/20/2021 I have asked Mr. Bridgett to discuss his mother's current condition  with his sisters as we feel patient prognosis is poor We recommend patients to be DNR/DNI  If she continues to decline palliative care hospice is recommended  UTI (urinary tract infection)- (present on admission) -UA positive for bacteria cultures > 100 K staph [place] -sensitive to Cipro -We will continue IV ciprofloxacin  Failure to thrive in adult Body mass index is 20.99 kg/m.  -Likely exacerbated by poor p.o. intake due to dementia -With cachexia, nutrition consulted, continue calorie count, dietary supplement if she would tolerate and take p.o. -Patient continues to have very poor oral intake gentle IV fluid hydration has been initiated to  the patient's family make the final decision regarding treatment plan and goals of care  Atrial fibrillation ([place]) [I48.91]- (present on admission) Continue Eliquis and metoprolol -with recurrent falls may consider discontinuing Eliquis at discharge -Remained encephalopathic, will try oral medication today again -As the risks outweighs the benefits, would not recommend for heparin drip at this time -Currently stable monitor  Malnutrition of moderate degree Body mass index is 18.2 kg/m. -Poor p.o. intake -Calorie count, nutrition consult, dietary supplements as tolerating p.o.  Elevated troponin- (present on admission) Troponin 70 Lateral leads ST depression which has been more subtly visible on previous EKGs Repeat troponin Continue to monitor on telemetry -Remained stable  Protein calorie malnutrition ([place])- (present on admission) Mild protein calorie malnutrition Encourage p.o. intake Continue to monitor -Dietitian will be consulted -Poor p.o. intake due to reduced mentation  Fall at home, initial encounter PT/OT -recommendation-placement -Based on today's evaluation patient needs placement, fall precaution, assist with all ADLs CT head shows no acute change Bruising over knee is old, current imaging not indicated If patient begins to localize pain consider more imaging  HTN (hypertension)- (present on admission) - On metoprolol, mildly bradycardic, heart rate 47 -We will hold metoprolol -As needed IV hydralazine will be utilized     ----------------------------------------------------------------------------------------------------------------------------------- Nutritional status:  The patient's BMI is: Body mass index is 18.2 kg/m. I agree with the assessment and plan as outlined below: Nutrition Status: Nutrition Problem: Moderate Malnutrition Etiology: chronic illness (dementia) Signs/Symptoms: per patient/family report, mild fat depletion, mild muscle  depletion, moderate muscle depletion, moderate fat depletion, energy intake < or equal to 50% for > or equal to 5 days (BMI 18.2) Interventions: Ensure Enlive (each supplement provides 350kcal and 20 grams of protein)   Cultures; Urine Culture  >>> NGT     DVT prophylaxis:  apixaban (ELIQUIS) tablet 2.5 mg Start: 05/19/21 0115 SCDs Start: 05/19/21 0114 apixaban (ELIQUIS) tablet 2.5 mg   Code Status:   Code Status: DNR  Family Communication:  Patient's son Mr. Fred Mccanless was called on 05/20/2021... He was updated, We have expressed our concern regarding CODE STATUS, and patient steady physical and mental decline...  Prognosis related to poor recommended DNR/DNI status-palliative versus hospice He is discussed with his sibling and get back to [place] later today or tomorrow  The above findings and plan of care has been discussed with her son  in detail, they expressed understanding and agreement of above. -Advance care planning has been discussed.   Admission status:   Status is: Inpatient Remains inpatient appropriate because: Needing IV fluids, IV antibiotics, worsening mental status change-continue discussion with family regarding goals of care and treatment plan    Disposition: From home,  To be determined -  likely home with palliative care versus hospice in 2-3 days         Procedures:   No admission procedures for  hospital encounter.   Antimicrobials:  Anti-infectives (From admission, onward)    Start     Dose/Rate Route Frequency Ordered Stop   05/21/21 1000  ciprofloxacin (CIPRO) IVPB 400 mg        400 mg 200 mL/hr over 60 Minutes Intravenous Every 24 hours 05/20/21 1518     05/19/21 0900  ciprofloxacin (CIPRO) IVPB 400 mg  Status:  Discontinued        400 mg 200 mL/hr over 60 Minutes Intravenous Every 12 hours 05/19/21 0103 05/20/21 1518   05/18/21 2345  ciprofloxacin (CIPRO) IVPB 400 mg        400 mg 200 mL/hr over 60 Minutes Intravenous  Once 05/18/21  2336 05/19/21 0159        Medication:   apixaban  2.5 mg Oral BID   feeding supplement  237 mL Oral TID BM   megestrol  400 mg Oral Daily   metoprolol tartrate  12.5 mg Oral BID    acetaminophen **OR** acetaminophen, haloperidol lactate, ondansetron **OR** ondansetron (ZOFRAN) IV, oxyCODONE   Objective:   Vitals:   05/20/21 1414 05/20/21 2205 05/20/21 2300 05/21/21 0438  BP: (!) 96/55 103/89  (!) 146/49  Pulse: 84 83  (!) 47  Resp: 16 18  18   Temp: 99.5 F (37.5 C) (!) 97.3 F (36.3 C)  97.9 F (36.6 C)  TempSrc: Axillary     SpO2: (!) 79% (!) 89% 91% 99%  Weight:      Height:        Intake/Output Summary (Last 24 hours) at 05/21/2021 1119 Last data filed at 05/21/2021 0900 Gross per 24 hour  Intake 855.91 ml  Output --  Net 855.91 ml   Filed Weights   05/19/21 0817 05/19/21 1242 05/20/21 0500  Weight: 50.4 kg 49.1 kg 48.1 kg     Examination:      General:  Confused not responding to verbal stimuli response to pain stimuli  HEENT:  Normocephalic, PERRL, otherwise with in Normal limits   Neuro:  Limited exam-confused CNII-XII intact. , normal motor and sensation, reflexes intact   Lungs:   Clear to auscultation BL, Respirations unlabored, no wheezes / crackles  Cardio:    S1/S2, A. fib, bradycardic no murmure, No Rubs or Gallops   Abdomen:   Soft, non-tender, bowel sounds active all four quadrants,  no guarding or peritoneal signs.  Muscular skeletal:  Severe generalized weaknesses, cachexia  Limited exam - in bed, able to move all 4 extremities,  2+ pulses,  symmetric, No pitting edema  Skin:  Dry, warm to touch, negative for any Rashes,  Wounds: Please see nursing documentation           ------------------------------------------------------------------------------------------------------------------------------------------    LABs:  CBC Latest Ref Rng & Units 05/21/2021 05/20/2021 05/19/2021  WBC 4.0 - 10.5 K/uL 11.9(H) 11.4(H) 11.7(H)  Hemoglobin  12.0 - 15.0 g/dL 8.6(L) 9.0(L) 9.9(L)  Hematocrit 36.0 - 46.0 % 29.5(L) 31.3(L) 33.7(L)  Platelets 150 - 400 K/uL 416(H) 395 366   CMP Latest Ref Rng & Units 05/21/2021 05/20/2021 05/20/2021  Glucose 70 - 99 mg/dL 104(H) 88 91  BUN 8 - 23 mg/dL 20 22 22   Creatinine 0.44 - 1.00 mg/dL 1.17(H) 1.16(H) 1.08(H)  Sodium 135 - 145 mmol/L 135 137 137  Potassium 3.5 - 5.1 mmol/L 3.4(L) 4.4 4.2  Chloride 98 - 111 mmol/L 105 106 106  CO2 22 - 32 mmol/L 24 22 23   Calcium 8.9 - 10.3 mg/dL 8.2(L) 8.6(L) 8.5(L)  Total Protein  6.5 - 8.1 g/dL - - -  Total Bilirubin 0.3 - 1.2 mg/dL - - -  Alkaline Phos 38 - 126 U/L - - -  AST 15 - 41 U/L - - -  ALT 0 - 44 U/L - - -       Micro Results Recent Results (from the past 240 hour(s))  Resp Panel by RT-PCR (Flu A&B, Covid) Nasopharyngeal Swab     Status: None   Collection Time: 05/18/21  7:38 PM   Specimen: Nasopharyngeal Swab; Nasopharyngeal(NP) swabs in vial transport medium  Result Value Ref Range Status   SARS Coronavirus 2 by RT PCR NEGATIVE NEGATIVE Final    Comment: (NOTE) SARS-CoV-2 target nucleic acids are NOT DETECTED.  The SARS-CoV-2 RNA is generally detectable in upper respiratory specimens during the acute phase of infection. The lowest concentration of SARS-CoV-2 viral copies this assay can detect is 138 copies/mL. A negative result does not preclude SARS-Cov-2 infection and should not be used as the sole basis for treatment or other patient management decisions. A negative result may occur with  improper specimen collection/handling, submission of specimen other than nasopharyngeal swab, presence of viral mutation(s) within the areas targeted by this assay, and inadequate number of viral copies(<138 copies/mL). A negative result must be combined with clinical observations, patient history, and epidemiological information. The expected result is Negative.  Fact Sheet for Patients:  EntrepreneurPulse.com.au  Fact Sheet  for Healthcare Providers:  IncredibleEmployment.be  This test is no t yet approved or cleared by the [place] FDA and  has been authorized for detection and/or diagnosis of SARS-CoV-2 by FDA under an Emergency Use Authorization (EUA). This EUA will remain  in effect (meaning this test can be used) for the duration of the COVID-19 declaration under Section 564(b)(1) of the Act, 21 U.S.C.section 360bbb-3(b)(1), unless the authorization is terminated  or revoked sooner.       Influenza A by PCR NEGATIVE NEGATIVE Final   Influenza B by PCR NEGATIVE NEGATIVE Final    Comment: (NOTE) The Xpert Xpress SARS-CoV-2/FLU/RSV plus assay is intended as an aid in the diagnosis of influenza from Nasopharyngeal swab specimens and should not be used as a sole basis for treatment. Nasal washings and aspirates are unacceptable for Xpert Xpress SARS-CoV-2/FLU/RSV testing.  Fact Sheet for Patients: EntrepreneurPulse.com.au  Fact Sheet for Healthcare Providers: IncredibleEmployment.be  This test is not yet approved or cleared by the [place] FDA and has been authorized for detection and/or diagnosis of SARS-CoV-2 by FDA under an Emergency Use Authorization (EUA). This EUA will remain in effect (meaning this test can be used) for the duration of the COVID-19 declaration under Section 564(b)(1) of the Act, 21 U.S.C. section 360bbb-3(b)(1), unless the authorization is terminated or revoked.  Performed at [facility], [address], [place], [place] [address]   Urine Culture     Status: Abnormal   Collection Time: 05/18/21  8:07 PM   Specimen: Urine, Catheterized  Result Value Ref Range Status   Specimen Description   Final    URINE, CATHETERIZED Performed at [facility], [address] [place], [place] [address]    Special Requests   Final    NONE Performed at [facility], [address] [place], [place] [address]    Culture  >=100,000 COLONIES/mL STAPHYLOCOCCUS HOMINIS (A)  Final   Report Status 05/21/2021 FINAL  Final   Organism ID, Bacteria STAPHYLOCOCCUS HOMINIS (A)  Final      Susceptibility   Staphylococcus hominis - MIC*  CIPROFLOXACIN <=0.5 SENSITIVE Sensitive     GENTAMICIN <=0.5 SENSITIVE Sensitive     NITROFURANTOIN <=16 SENSITIVE Sensitive     OXACILLIN >=4 RESISTANT Resistant     TETRACYCLINE 2 SENSITIVE Sensitive     VANCOMYCIN 1 SENSITIVE Sensitive     TRIMETH/SULFA <=10 SENSITIVE Sensitive     CLINDAMYCIN <=0.25 SENSITIVE Sensitive     RIFAMPIN <=0.5 SENSITIVE Sensitive     Inducible Clindamycin NEGATIVE Sensitive     * >=100,000 COLONIES/mL STAPHYLOCOCCUS HOMINIS  MRSA Next Gen by PCR, Nasal     Status: None   Collection Time: 05/19/21 12:30 PM   Specimen: Nasal Mucosa; Nasal Swab  Result Value Ref Range Status   MRSA by PCR Next Gen NOT DETECTED NOT DETECTED Final    Comment: (NOTE) The GeneXpert MRSA Assay (FDA approved for NASAL specimens only), is one component of a comprehensive MRSA colonization surveillance program. It is not intended to diagnose MRSA infection nor to guide or monitor treatment for MRSA infections. Test performance is not FDA approved in patients less than 26 years old. Performed at [facility], [address] [place], [place] [address]     Radiology Reports No results found.  SIGNED: Deatra James, MD, FHM. [facility],  Pager (please use [email or web address] to page/text) Please use Epic Secure Chat for non-urgent communication (7AM-7PM)  If 7PM-7AM, please contact night-coverage [email or web address], 05/21/2021, 11:19 AM

## 2021-05-21 NOTE — Plan of Care (Signed)
  Problem: Education: Goal: Knowledge of General Education information will improve Description Including pain rating scale, medication(s)/side effects and non-pharmacologic comfort measures Outcome: Progressing   

## 2021-05-21 NOTE — Assessment & Plan Note (Signed)
Body mass index is 18.2 kg/m. -Poor p.o. intake -Calorie count, nutrition consult, dietary supplements as tolerating p.o.

## 2021-05-21 NOTE — Assessment & Plan Note (Signed)
-  UA positive for bacteria cultures > 100 K staph [place] -sensitive to Cipro -We will continue IV ciprofloxacin

## 2021-05-22 ENCOUNTER — Encounter (HOSPITAL_COMMUNITY): Payer: Self-pay | Admitting: Family Medicine

## 2021-05-22 LAB — CBC
HCT: 30.4 % — ABNORMAL LOW (ref 36.0–46.0)
Hemoglobin: 8.6 g/dL — ABNORMAL LOW (ref 12.0–15.0)
MCH: 19.6 pg — ABNORMAL LOW (ref 26.0–34.0)
MCHC: 28.3 g/dL — ABNORMAL LOW (ref 30.0–36.0)
MCV: 69.2 fL — ABNORMAL LOW (ref 80.0–100.0)
Platelets: 401 10*3/uL — ABNORMAL HIGH (ref 150–400)
RBC: 4.39 MIL/uL (ref 3.87–5.11)
RDW: 20 % — ABNORMAL HIGH (ref 11.5–15.5)
WBC: 9.1 10*3/uL (ref 4.0–10.5)
nRBC: 0 % (ref 0.0–0.2)

## 2021-05-22 LAB — BASIC METABOLIC PANEL
Anion gap: 7 (ref 5–15)
BUN: 17 mg/dL (ref 8–23)
CO2: 25 mmol/L (ref 22–32)
Calcium: 8.1 mg/dL — ABNORMAL LOW (ref 8.9–10.3)
Chloride: 101 mmol/L (ref 98–111)
Creatinine, Ser: 0.93 mg/dL (ref 0.44–1.00)
GFR, Estimated: 58 mL/min — ABNORMAL LOW (ref >60)
Glucose, Bld: 104 mg/dL — ABNORMAL HIGH (ref 70–99)
Potassium: 3.7 mmol/L (ref 3.5–5.1)
Sodium: 133 mmol/L — ABNORMAL LOW (ref 135–145)

## 2021-05-22 LAB — VITAMIN B1: Vitamin B1 (Thiamine): 116.4 nmol/L (ref 66.5–200.0)

## 2021-05-22 MED ORDER — MEGESTROL ACETATE 400 MG/10ML PO SUSP: 400.0000 mg | Freq: Every day | ORAL | 0 refills | Status: AC

## 2021-05-22 MED ORDER — ENSURE ENLIVE PO LIQD: 237.0000 mL | Freq: Three times a day (TID) | ORAL | 12 refills | Status: AC

## 2021-05-22 NOTE — Plan of Care (Signed)
  Problem: Education: Goal: Knowledge of General Education information will improve Description Including pain rating scale, medication(s)/side effects and non-pharmacologic comfort measures Outcome: Progressing   Problem: Health Behavior/Discharge Planning: Goal: Ability to manage health-related needs will improve Outcome: Progressing   

## 2021-05-22 NOTE — TOC Transition Note (Signed)
Transition of Care [facility]) - CM/SW Discharge Note   Patient Details  Name: Tracy [name] MRN: [id number] Date of Birth: 1926-08-28  Transition of Care [facility] CM/SW Contact:  Servando Snare, LCSW Phone Number: 05/22/2021, 12:47 PM   Clinical Narrative:   Home health arranged with [facility]. Patient will need transportation home. Son asked for notification prior to patient leaving so he can arrange help at home.     Final next level of care: [place] Barriers to Discharge: No Barriers Identified   Patient Goals and CMS Choice Patient states their goals for this hospitalization and ongoing recovery are:: to go home.   Choice offered to / list presented to : NA  Discharge Placement                  Name of family member notified: Legrand Como, son    Discharge Plan and Services                DME Arranged: N/A DME Agency: NA       HH Arranged: RN, PT, OT, Social Work, Nurse's Aide [place] Agency: [place] Date [facility] Agency Contacted: 05/22/21 Time [place]: 1212 Representative spoke with at [place]: [place] ([place]) Interventions     Readmission Risk Interventions Readmission Risk Prevention Plan 05/19/2021  Medication Screening Complete  Transportation Screening Complete  Some recent data might be hidden

## 2021-05-23 DIAGNOSIS — G9341 Metabolic encephalopathy: Secondary | ICD-10-CM | POA: Diagnosis not present

## 2021-05-23 LAB — CBC
HCT: 30.1 % — ABNORMAL LOW (ref 36.0–46.0)
Hemoglobin: 8.9 g/dL — ABNORMAL LOW (ref 12.0–15.0)
MCH: 20.8 pg — ABNORMAL LOW (ref 26.0–34.0)
MCHC: 29.6 g/dL — ABNORMAL LOW (ref 30.0–36.0)
MCV: 70.3 fL — ABNORMAL LOW (ref 80.0–100.0)
Platelets: 420 10*3/uL — ABNORMAL HIGH (ref 150–400)
RBC: 4.28 MIL/uL (ref 3.87–5.11)
RDW: 20.3 % — ABNORMAL HIGH (ref 11.5–15.5)
WBC: 9.5 10*3/uL (ref 4.0–10.5)
nRBC: 0 % (ref 0.0–0.2)

## 2021-05-23 LAB — BASIC METABOLIC PANEL
Anion gap: 5 (ref 5–15)
BUN: 14 mg/dL (ref 8–23)
CO2: 26 mmol/L (ref 22–32)
Calcium: 8.2 mg/dL — ABNORMAL LOW (ref 8.9–10.3)
Chloride: 103 mmol/L (ref 98–111)
Creatinine, Ser: 0.93 mg/dL (ref 0.44–1.00)
GFR, Estimated: 58 mL/min — ABNORMAL LOW (ref >60)
Glucose, Bld: 82 mg/dL (ref 70–99)
Potassium: 3.8 mmol/L (ref 3.5–5.1)
Sodium: 134 mmol/L — ABNORMAL LOW (ref 135–145)

## 2021-05-23 NOTE — Progress Notes (Signed)
Physician Discharge Summary [facility]    Patient: Tracy [name]                   Admit date: 05/18/2021   DOB: 1926-06-11             Discharge date:05/23/2021/11:43 AM [id number]                          PCP: Lyman Bishop, DO  Disposition:     HOME with Home Health   Recommendations for Outpatient Follow-up:   Follow up: with PCP in 1 week  Outpatient referral to palliative care team, with close follow-up  Discharge Condition: Stable   Code Status:   Code Status: DNR  Diet recommendation: Regular healthy diet   Discharge Diagnoses:    Principal Problem:   Acute metabolic encephalopathy Active Problems:   UTI (urinary tract infection)   Failure to thrive in adult   Atrial fibrillation ([place]) [I48.91]   HTN (hypertension)   Fall at home, initial encounter   Protein calorie malnutrition ([place])   Elevated troponin   Malnutrition of moderate degree   History of Present Illness/ Hospital Course Tracy [name] Summary:    Per HPI: Tracy [name] is a 88 y.o. female with medical history significant of history of atrial fibrillation, CKD, dementia, hypertension, and more presents ED with a chief complaint of fall and worsening dementia.  Unfortunately patient is not able to provide any history at all.  At the time of my exam she is moving around in the bed and talking incoherently.  ED provider reports that he was able to reach family who stated that the patient's dementia is worsening.  She is also having increased number of falls in the past few days.  She fell once today, and fell once 4 days ago.  She has been taking Tylenol for the pain status post falls.  She also walked into the closet by accident today.  They also reported decrease in p.o. intake for 1 week.  She did not eat anything on the day of presentation.  ED provider also noted abdominal tenderness which is not noted on my exam.  She does have a massive ventral hernia on the left side of her abdomen.   No further history could be obtained at this time.   ED Temp 99, heart rate 78-110, respiratory rate 17-22, blood pressure 136/110-monitoring 39/90.,  Satting at 100% Patient does have slight leukocytosis 11.8 and thrombocytosis 405-likely due to acute phase reaction Chemistry panel is unremarkable Albumin 3.4, T. bili 1.4 Negative respiratory panel UA is borderline CT abdomen pelvis shows large ventral hernia with right gluteal hematoma CT head shows nothing acute Chest x-ray shows no acute abnormalities EKG shows heart rate 83, atrial fibrillation, normal QTc, some ST depression has been present on previous Patient was given Ativan, urine culture ordered, Cipro started (penicillin allergy), normal saline 500 mL bolus  Assessment and Plan: * Acute metabolic encephalopathy- (present on admission) -In the setting of end-stage dementia - Likely multifactorial with worsening nutritional status, worsening dementia, possible infection -Continue treating urinary tract infection - Continue to monitor electrolytes, check for nutritional deficiencies -CT head shows no acute changes -Discussed with the patient's POA son Mr. Ryleigh Fradella Who confirms the patient dementia is worse but she was reactive and communicating with him After extensive discussion over the phone on 05/20/2021 I have asked Mr. Rood to discuss his mother's current condition with  his sisters as we feel patient prognosis is poor We recommend patients to be DNR/DNI  If she continues to decline palliative care hospice is recommended  UTI (urinary tract infection)- (present on admission) -UA positive for bacteria cultures > 100 K staph [place] -sensitive to Cipro -We will continue IV ciprofloxacin  Failure to thrive in adult Body mass index is 20.99 kg/m.  -Likely exacerbated by poor p.o. intake due to dementia -With cachexia, nutrition consulted, continue calorie count, dietary supplement if she would tolerate and take  p.o. -Patient continues to have very poor oral intake gentle IV fluid hydration has been initiated to the patient's family make the final decision regarding treatment plan and goals of care  Atrial fibrillation ([place]) [I48.91]- (present on admission) Continue Eliquis and metoprolol -with recurrent falls may consider discontinuing Eliquis at discharge -Remained encephalopathic, will try oral medication today again -As the risks outweighs the benefits, would not recommend for heparin drip at this time -Currently stable monitor  Malnutrition of moderate degree Body mass index is 18.2 kg/m. -Poor p.o. intake -Calorie count, nutrition consult, dietary supplements as tolerating p.o.  Elevated troponin- (present on admission) Troponin 70 Lateral leads ST depression which has been more subtly visible on previous EKGs Repeat troponin Continue to monitor on telemetry -Remained stable  Protein calorie malnutrition ([place])- (present on admission) Mild protein calorie malnutrition Encourage p.o. intake Continue to monitor -Dietitian will be consulted -Poor p.o. intake due to reduced mentation  Fall at home, initial encounter PT/OT -recommendation-placement -Based on today's evaluation patient needs placement, fall precaution, assist with all ADLs CT head shows no acute change Bruising over knee is old, current imaging not indicated If patient begins to localize pain consider more imaging  HTN (hypertension)- (present on admission) - On metoprolol, mildly bradycardic, heart rate 47 -We will hold metoprolol -As needed IV hydralazine will be utilized     Nutritional status:  Nutrition Problem: Moderate Malnutrition Etiology: chronic illness (dementia) Signs/Symptoms: per patient/family report, mild fat depletion, mild muscle depletion, moderate muscle depletion, moderate fat depletion, energy intake < or equal to 50% for > or equal to 5 days (BMI 18.2) Interventions: Ensure Enlive (each  supplement provides 350kcal and 20 grams of protein)  The patient's BMI is: Body mass index is 18.2 kg/m. I agree with the assessment and plan as outlined below: Nutrition Status: Nutrition Problem: Moderate Malnutrition Etiology: chronic illness (dementia) Signs/Symptoms: per patient/family report, mild fat depletion, mild muscle depletion, moderate muscle depletion, moderate fat depletion, energy intake < or equal to 50% for > or equal to 5 days (BMI 18.2) Interventions: Ensure Enlive (each supplement provides 350kcal and 20 grams of protein)    Discharge Instructions:   Discharge Instructions     Activity as tolerated - No restrictions   Complete by: As directed    Amb Referral to Palliative Care   Complete by: As directed    Diet - low sodium heart healthy   Complete by: As directed    Discharge instructions   Complete by: As directed    Continue current medication, aggressive PT OT, fall precautions, Follow-up with palliative care   Increase activity slowly   Complete by: As directed           Follow-up Information     [facility], [facility] Follow up.   Specialty: Home Health Services Why: Wil contact you to schedule home health visits. Contact information: [address] [place] [address] [phone]  Vaccines:  Also highly recommended update your vaccines requirement, including SARS-CoV-2 , yearly  flu vaccine and pneumonia vaccine at least every 5 years.      Exercise: If you are able: 30 -60 minutes a day, as tolerated in bed with a fall cautions Procedures /Studies:   DG Chest 1 View  Result Date: 05/18/2021 CLINICAL DATA:  Multiple falls. EXAM: CHEST  1 VIEW COMPARISON:  Chest x-ray 04/03/2013 FINDINGS: Heart is enlarged, unchanged. There is stable prominence of the right paratracheal region. There is some scarring in the lung apices. The lungs are otherwise clear. There is no pleural effusion or  pneumothorax. No acute fractures are seen. IMPRESSION: 1. Stable cardiomegaly. 2. No acute cardiopulmonary process. Electronically Signed   By: Ronney Asters M.D.   On: 05/18/2021 22:27   CT Head Wo Contrast  Result Date: 05/18/2021 CLINICAL DATA:  Altered level of consciousness, multiple falls EXAM: CT HEAD WITHOUT CONTRAST TECHNIQUE: Contiguous axial images were obtained from the base of the skull through the vertex without intravenous contrast. RADIATION DOSE REDUCTION: This exam was performed according to the departmental dose-optimization program which includes automated exposure control, adjustment of the mA and/or kV according to patient size and/or use of iterative reconstruction technique. COMPARISON:  10/14/2015 FINDINGS: Brain: Evaluation is slightly limited by patient motion during the exam. Chronic right basal ganglia lacunar infarct again noted. There are chronic small-vessel ischemic changes throughout the periventricular white matter. No evidence of acute infarct or hemorrhage. The lateral ventricles and remaining midline structures are otherwise unremarkable. No acute extra-axial fluid collections. No mass effect. Vascular: No hyperdense vessel or unexpected calcification. Skull: Normal. Negative for fracture or focal lesion. Sinuses/Orbits: No acute finding. Other: None. IMPRESSION: 1. Stable head CT, no acute intracranial process. Electronically Signed   By: Randa Ngo M.D.   On: 05/18/2021 22:39   CT Abdomen Pelvis W Contrast  Result Date: 05/18/2021 CLINICAL DATA:  Evaluate for bowel obstruction.  Multiple falls. EXAM: CT ABDOMEN AND PELVIS WITH CONTRAST TECHNIQUE: Multidetector CT imaging of the abdomen and pelvis was performed using the standard protocol following bolus administration of intravenous contrast. RADIATION DOSE REDUCTION: This exam was performed according to the departmental dose-optimization program which includes automated exposure control, adjustment of the mA and/or  kV according to patient size and/or use of iterative reconstruction technique. CONTRAST:  67mL OMNIPAQUE IOHEXOL 300 MG/ML  SOLN COMPARISON:  None. FINDINGS: Lower chest: The heart is enlarged.  Lung bases are grossly clear. Hepatobiliary: There is a homogeneously enhancing lesion in the left lobe of the liver measuring 1.4 x 1.9 cm. This is isodense to the liver on delayed imaging. No other liver lesions are identified. Gallbladder is within normal limits. No biliary ductal dilatation. Pancreas: Limited evaluation secondary to motion artifact. No definite acute or focal abnormality. Spleen: Normal in size without focal abnormality. Adrenals/Urinary Tract: Limited evaluation secondary to motion artifact. No hydronephrosis or perinephric fluid. Adrenal glands and bladder are within normal limits. Stomach/Bowel: No dilated bowel loops identified. There is colonic diverticulosis without evidence for diverticulitis. The appendix is not seen. The stomach is within normal limits. There is a large amount of stool dilating the rectum. Rectum measures 7.4 cm in diameter. Vascular/Lymphatic: Aortic atherosclerosis. No enlarged abdominal or pelvic lymph nodes. Reproductive: There is a 1.7 cm right adnexal cyst. Left ovary not well seen. Uterus appears atrophic. Other: There is no ascites or free air. There is a large ventral hernia containing portions of the stomach, pancreas, large bowel and  small bowel. Wall defect measures 4.8 by 6.3 cm. Hernia sac is left of midline on this study measuring 14.5 x 7 by 14.8 cm. There is no edema in the hernia sac. Musculoskeletal: Bones are diffusely osteopenic. Multilevel degenerative changes affect the spine. No acute fractures are seen. There is intramuscular hyperdensity in surrounding subcutaneous stranding in the inferior right gluteus muscle, incompletely imaged. IMPRESSION: 1. Large ventral hernia containing colon, small bowel, pancreas and stomach. No evidence for bowel  obstruction. 2. Right gluteal musculature hyperdensity with surrounding edema may represent hematoma. Please correlate clinically. Differential diagnosis would include infection or other soft tissue mass. 3. The rectum is dilated and stool-filled. 4. Sigmoid colon diverticulosis. 5. 1.7 cm right adnexal simple cysts. Note: This recommendation does not apply to premenarchal patients and to those with increased risk (genetic, family history, elevated tumor markers or other high-risk factors) of ovarian cancer. Reference: JACR 2020 Feb; 17(2):248-254 6. Enhancing liver lesion measuring 1.9 cm. Findings may represent a flash fill hemangioma, but are indeterminate. Recommend further characterization with MRI. 7.  Aortic Atherosclerosis (ICD10-I70.0). Electronically Signed   By: Ronney Asters M.D.   On: 05/18/2021 22:40   EEG adult  Result Date: 05/19/2021 Lora Havens, MD     05/19/2021 12:28 PM Patient Name: Tracy [name] MRN: [id number] Epilepsy Attending: Lora Havens Referring Physician/Provider: Rolla Plate, DO Date: 05/19/2021 Duration: 23.45 mins Patient history: 88yo F with ams. EEG to evaluate for seizure Level of alertness: Awake, asleep AEDs during EEG study: None Technical aspects: This EEG study was done with scalp electrodes positioned according to the 10-20 International system of electrode placement. Electrical activity was acquired at a sampling rate of 500Hz  and reviewed with a high frequency filter of 70Hz  and a low frequency filter of 1Hz . EEG data were recorded continuously and digitally stored. Description: The posterior dominant rhythm consists of 10 Hz activity of moderate voltage (25-35 uV) seen predominantly in posterior head regions, symmetric and reactive to eye opening and eye closing. Sleep was characterized by vertex waves, sleep spindles (12 to 14 Hz), maximal frontocentral region. Hyperventilation did not show any EEG change.  Physiologic photic driving was not seen  during photic stimulation.  IMPRESSION: This study is within normal limits. No seizures or epileptiform discharges were seen throughout the recording. Priyanka Barbra Sarks    Subjective:   Patient was seen and examined 05/23/2021, 11:43 AM Patient stable today. No acute distress.  No issues overnight Stable for discharge.  Discharge Exam:    Vitals:   05/22/21 0630 05/22/21 1443 05/22/21 2057 05/23/21 0524  BP: 133/82 (!) 143/108 (!) 150/96 136/71  Pulse: 90 72 84 (!) 109  Resp: 18 16 19 19   Temp: (!) 97.5 F (36.4 C) 99.3 F (37.4 C) (!) 97.5 F (36.4 C) 97.6 F (36.4 C)  TempSrc: Oral  Oral Oral  SpO2: 95% 100% 100% 100%  Weight:      Height:        General: Pt lying comfortably in bed & appears in no obvious distress. Cardiovascular: S1 & S2 heard, RRR, S1/S2 +. No murmurs, rubs, gallops or clicks. No JVD or pedal edema. Respiratory: Clear to auscultation without wheezing, rhonchi or crackles. No increased work of breathing. Abdominal:  Non-distended, non-tender & soft. No organomegaly or masses appreciated. Normal bowel sounds heard. CNS: Alert and oriented. No focal deficits. Extremities: no edema, no cyanosis      The results of significant diagnostics from this hospitalization (including imaging, microbiology,  ancillary and laboratory) are listed below for reference.      Microbiology:   Recent Results (from the past 240 hour(s))  Resp Panel by RT-PCR (Flu A&B, Covid) Nasopharyngeal Swab     Status: None   Collection Time: 05/18/21  7:38 PM   Specimen: Nasopharyngeal Swab; Nasopharyngeal(NP) swabs in vial transport medium  Result Value Ref Range Status   SARS Coronavirus 2 by RT PCR NEGATIVE NEGATIVE Final    Comment: (NOTE) SARS-CoV-2 target nucleic acids are NOT DETECTED.  The SARS-CoV-2 RNA is generally detectable in upper respiratory specimens during the acute phase of infection. The lowest concentration of SARS-CoV-2 viral copies this assay can detect  is 138 copies/mL. A negative result does not preclude SARS-Cov-2 infection and should not be used as the sole basis for treatment or other patient management decisions. A negative result may occur with  improper specimen collection/handling, submission of specimen other than nasopharyngeal swab, presence of viral mutation(s) within the areas targeted by this assay, and inadequate number of viral copies(<138 copies/mL). A negative result must be combined with clinical observations, patient history, and epidemiological information. The expected result is Negative.  Fact Sheet for Patients:  EntrepreneurPulse.com.au  Fact Sheet for Healthcare Providers:  IncredibleEmployment.be  This test is no t yet approved or cleared by the [place] FDA and  has been authorized for detection and/or diagnosis of SARS-CoV-2 by FDA under an Emergency Use Authorization (EUA). This EUA will remain  in effect (meaning this test can be used) for the duration of the COVID-19 declaration under Section 564(b)(1) of the Act, 21 U.S.C.section 360bbb-3(b)(1), unless the authorization is terminated  or revoked sooner.       Influenza A by PCR NEGATIVE NEGATIVE Final   Influenza B by PCR NEGATIVE NEGATIVE Final    Comment: (NOTE) The Xpert Xpress SARS-CoV-2/FLU/RSV plus assay is intended as an aid in the diagnosis of influenza from Nasopharyngeal swab specimens and should not be used as a sole basis for treatment. Nasal washings and aspirates are unacceptable for Xpert Xpress SARS-CoV-2/FLU/RSV testing.  Fact Sheet for Patients: EntrepreneurPulse.com.au  Fact Sheet for Healthcare Providers: IncredibleEmployment.be  This test is not yet approved or cleared by the [place] FDA and has been authorized for detection and/or diagnosis of SARS-CoV-2 by FDA under an Emergency Use Authorization (EUA). This EUA will remain in effect  (meaning this test can be used) for the duration of the COVID-19 declaration under Section 564(b)(1) of the Act, 21 U.S.C. section 360bbb-3(b)(1), unless the authorization is terminated or revoked.  Performed at [facility], [address] [place], [place] [address]   Urine Culture     Status: Abnormal   Collection Time: 05/18/21  8:07 PM   Specimen: Urine, Catheterized  Result Value Ref Range Status   Specimen Description   Final    URINE, CATHETERIZED Performed at [facility], [address] [place], [place] [address]    Special Requests   Final    NONE Performed at [facility], [address] [place], [place] [address]    Culture >=100,000 COLONIES/mL STAPHYLOCOCCUS HOMINIS (A)  Final   Report Status 05/21/2021 FINAL  Final   Organism ID, Bacteria STAPHYLOCOCCUS HOMINIS (A)  Final      Susceptibility   Staphylococcus hominis - MIC*    CIPROFLOXACIN <=0.5 SENSITIVE Sensitive     GENTAMICIN <=0.5 SENSITIVE Sensitive     NITROFURANTOIN <=16 SENSITIVE Sensitive     OXACILLIN >=4 RESISTANT Resistant     TETRACYCLINE 2  SENSITIVE Sensitive     VANCOMYCIN 1 SENSITIVE Sensitive     TRIMETH/SULFA <=10 SENSITIVE Sensitive     CLINDAMYCIN <=0.25 SENSITIVE Sensitive     RIFAMPIN <=0.5 SENSITIVE Sensitive     Inducible Clindamycin NEGATIVE Sensitive     * >=100,000 COLONIES/mL STAPHYLOCOCCUS HOMINIS  MRSA Next Gen by PCR, Nasal     Status: None   Collection Time: 05/19/21 12:30 PM   Specimen: Nasal Mucosa; Nasal Swab  Result Value Ref Range Status   MRSA by PCR Next Gen NOT DETECTED NOT DETECTED Final    Comment: (NOTE) The GeneXpert MRSA Assay (FDA approved for NASAL specimens only), is one component of a comprehensive MRSA colonization surveillance program. It is not intended to diagnose MRSA infection nor to guide or monitor treatment for MRSA infections. Test performance is not FDA approved in patients less than 47 years old. Performed at [facility], [address],  [place], [place] [address]      Labs:   CBC: Recent Labs  Lab 05/18/21 1747 05/19/21 0343 05/20/21 0404 05/21/21 0459 05/22/21 0606 05/23/21 0508  WBC 11.8* 11.7* 11.4* 11.9* 9.1 9.5  NEUTROABS 9.1* 9.0*  --   --   --   --   HGB 9.8* 9.9* 9.0* 8.6* 8.6* 8.9*  HCT 32.4* 33.7* 31.3* 29.5* 30.4* 30.1*  MCV 68.9* 69.9* 68.9* 70.2* 69.2* 70.3*  PLT 405* 366 395 416* 401* 0000000*   Basic Metabolic Panel: Recent Labs  Lab 05/19/21 0343 05/20/21 0108 05/20/21 0404 05/21/21 0459 05/22/21 0606 05/23/21 0508  NA 135 137 137 135 133* 134*  K 4.0 4.2 4.4 3.4* 3.7 3.8  CL 105 106 106 105 101 103  CO2 22 23 22 24 25 26   GLUCOSE 99 91 88 104* 104* 82  BUN 25* 22 22 20 17 14   CREATININE 1.08* 1.08* 1.16* 1.17* 0.93 0.93  CALCIUM 8.6* 8.5* 8.6* 8.2* 8.1* 8.2*  MG 1.9  --   --   --   --   --    Liver Function Tests: Recent Labs  Lab 05/18/21 1747 05/19/21 0343  AST 21 29  ALT 17 15  ALKPHOS 71 71  BILITOT 1.4* 1.0  PROT 6.4* 6.5  ALBUMIN 3.4* 3.4*    Urinalysis    Component Value Date/Time   COLORURINE YELLOW 05/18/2021 2007   APPEARANCEUR HAZY (A) 05/18/2021 2007   LABSPEC 1.020 05/18/2021 2007   PHURINE 7.0 05/18/2021 2007   GLUCOSEU NEGATIVE 05/18/2021 2007   HGBUR TRACE (A) 05/18/2021 2007   BILIRUBINUR NEGATIVE 05/18/2021 2007   [place] NEGATIVE 05/18/2021 2007   PROTEINUR NEGATIVE 05/18/2021 2007   UROBILINOGEN 1.0 03/25/2013 1726   NITRITE NEGATIVE 05/18/2021 2007   LEUKOCYTESUR NEGATIVE 05/18/2021 2007         Time coordinating discharge: Over 45 minutes  SIGNED: Deatra James, MD, FACP, FHM. [facility],  Please use [email or web address] to Page If 7PM-7AM, please contact night-coverage [email or web address],  05/23/2021, 11:43 AM

## 2021-05-23 NOTE — TOC Transition Note (Addendum)
Transition of Care [facility]) - CM/SW Discharge Note   Patient Details  Name: Tracy [name] MRN: [id number] Date of Birth: March 19, 1927  [facility] CM/SW Contact:  Karn Cassis, LCSW Phone Number: 05/23/2021, 8:43 AM   Clinical Narrative:  Pt d/c today and will return home with home health. Pt's son aware and agreeable. LCSW confirmed pt's address. Son requests transport via [facility]. He asked that he be notified when EMS picks up pt. RN notified and floor will arrange EMS transport home. Cory with [facility] aware of d/c today. Home health orders are in.    Update: MD requests outpatient palliative follow up. LCSW discussed with son who is agreeable to refer to [facility] and Palliative Care. Referral made.    Final next level of care: Home w Home Health Services Barriers to Discharge: Barriers Resolved   Patient Goals and CMS Choice Patient states their goals for this hospitalization and ongoing recovery are:: to go home.   Choice offered to / list presented to : NA  Discharge Placement                  Name of family member notified: Casimiro Needle, son Patient and family notified of of transfer: 05/23/21  Discharge Plan and Services                DME Arranged: N/A DME Agency: NA       HH Arranged: RN, PT, OT, Social Work, Nurse's Aide HH Agency: [facility] Date [facility] Agency Contacted: 05/22/21 Time HH Agency Contacted: 1212 Representative spoke with at [facility] Agency: Denyse Amass  Social Determinants of Health (SDOH) Interventions     Readmission Risk Interventions Readmission Risk Prevention Plan 05/19/2021  Medication Screening Complete  Transportation Screening Complete  Some recent data might be hidden

## 2021-05-30 NOTE — Discharge Summary (Signed)
Physician Discharge Summary   Patient: Tracy [name] MRN: [id number] DOB: March 19, 1927  Admit date:     05/18/2021  Discharge date: 05/23/2021  Discharge Physician: Deatra James   PCP: Lyman Bishop, DO   Recommendations at discharge:    PCP in 1 wk Palliative care ASAP  Discharge Diagnoses: Principal Problem:   Acute metabolic encephalopathy Active Problems:   UTI (urinary tract infection)   Failure to thrive in adult   Atrial fibrillation (HCC) [I48.91]   HTN (hypertension)   Fall at home, initial encounter   Protein calorie malnutrition ([place])   Elevated troponin   Malnutrition of moderate degree  Resolved Problems:   * No resolved hospital problems. *   Hospital Course: Per HPI: Tracy [name] is a 88 y.o. female with medical history significant of history of atrial fibrillation, CKD, dementia, hypertension, and more presents ED with a chief complaint of fall and worsening dementia.  Unfortunately patient is not able to provide any history at all.  At the time of my exam she is moving around in the bed and talking incoherently.  ED provider reports that he was able to reach family who stated that the patient's dementia is worsening.  She is also having increased number of falls in the past few days.  She fell once today, and fell once 4 days ago.  She has been taking Tylenol for the pain status post falls.  She also walked into the closet by accident today.  They also reported decrease in p.o. intake for 1 week.  She did not eat anything on the day of presentation.  ED provider also noted abdominal tenderness which is not noted on my exam.  She does have a massive ventral hernia on the left side of her abdomen.  No further history could be obtained at this time.   ED Temp 99, heart rate 78-110, respiratory rate 17-22, blood pressure 136/110-monitoring 39/90.,  Satting at 100% Patient does have slight leukocytosis 11.8 and thrombocytosis 405-likely due to acute phase  reaction Chemistry panel is unremarkable Albumin 3.4, T. bili 1.4 Negative respiratory panel UA is borderline CT abdomen pelvis shows large ventral hernia with right gluteal hematoma CT head shows nothing acute Chest x-ray shows no acute abnormalities EKG shows heart rate 83, atrial fibrillation, normal QTc, some ST depression has been present on previous Patient was given Ativan, urine culture ordered, Cipro started (penicillin allergy), normal saline 500 mL bolus  Assessment and Plan: * Acute metabolic encephalopathy- (present on admission) -In the setting of end-stage dementia - Likely multifactorial with worsening nutritional status, worsening dementia, possible infection -Continue treating urinary tract infection - Continue to monitor electrolytes, check for nutritional deficiencies -CT head shows no acute changes -Discussed with the patient's POA son Mr. Idelia Boyzo Who confirms the patient dementia is worse but she was reactive and communicating with him After extensive discussion over the phone on 05/20/2021 I have asked Mr. Ancona to discuss his mother's current condition with his sisters as we feel patient prognosis is poor We recommend patients to be DNR/DNI  If she continues to decline palliative care hospice is recommended  UTI (urinary tract infection)- (present on admission) -UA positive for bacteria cultures > 100 K staph [place] -sensitive to Cipro -We will continue IV ciprofloxacin  Failure to thrive in adult Body mass index is 20.99 kg/m.  -Likely exacerbated by poor p.o. intake due to dementia -With cachexia, nutrition consulted, continue calorie count, dietary supplement if she would tolerate  and take p.o. -Patient continues to have very poor oral intake gentle IV fluid hydration has been initiated to the patient's family make the final decision regarding treatment plan and goals of care  Atrial fibrillation ([place]) [I48.91]- (present on admission) Continue  Eliquis and metoprolol -with recurrent falls may consider discontinuing Eliquis at discharge -Remained encephalopathic, will try oral medication today again -As the risks outweighs the benefits, would not recommend for heparin drip at this time -Currently stable monitor  Malnutrition of moderate degree Body mass index is 18.2 kg/m. -Poor p.o. intake -Calorie count, nutrition consult, dietary supplements as tolerating p.o.  Elevated troponin- (present on admission) Troponin 70 Lateral leads ST depression which has been more subtly visible on previous EKGs Repeat troponin Continue to monitor on telemetry -Remained stable  Protein calorie malnutrition ([place])- (present on admission) Mild protein calorie malnutrition Encourage p.o. intake Continue to monitor -Dietitian will be consulted -Poor p.o. intake due to reduced mentation  Fall at home, initial encounter PT/OT -recommendation-placement -Based on today's evaluation patient needs placement, fall precaution, assist with all ADLs CT head shows no acute change Bruising over knee is old, current imaging not indicated If patient begins to localize pain consider more imaging  HTN (hypertension)- (present on admission) - On metoprolol, mildly bradycardic, heart rate 47 -We will hold metoprolol -As needed IV hydralazine will be utilized     Disposition: Home health Diet recommendation:  Discharge Diet Orders (From admission, onward)     Start     Ordered   05/22/21 0000  Diet - low sodium heart healthy        05/22/21 1043           Cardiac diet  DISCHARGE MEDICATION:    Follow-up Information     [facility]   Specialty: Home Health Services Why: Wil contact you to schedule home health visits. Contact information: [place] [address] [phone]                 Discharge Exam: Filed Weights   05/19/21 0817 05/19/21 1242 05/20/21 0500  Weight:  50.4 kg 49.1 kg 48.1 kg   Stable   Condition at discharge: stable  The results of significant diagnostics from this hospitalization (including imaging, microbiology, ancillary and laboratory) are listed below for reference.   Imaging Studies: DG Chest 1 View  Result Date: 05/18/2021 CLINICAL DATA:  Multiple falls. EXAM: CHEST  1 VIEW COMPARISON:  Chest x-ray 04/03/2013 FINDINGS: Heart is enlarged, unchanged. There is stable prominence of the right paratracheal region. There is some scarring in the lung apices. The lungs are otherwise clear. There is no pleural effusion or pneumothorax. No acute fractures are seen. IMPRESSION: 1. Stable cardiomegaly. 2. No acute cardiopulmonary process. Electronically Signed   By: Ronney Asters M.D.   On: 05/18/2021 22:27   CT Head Wo Contrast  Result Date: 05/18/2021 CLINICAL DATA:  Altered level of consciousness, multiple falls EXAM: CT HEAD WITHOUT CONTRAST TECHNIQUE: Contiguous axial images were obtained from the base of the skull through the vertex without intravenous contrast. RADIATION DOSE REDUCTION: This exam was performed according to the departmental dose-optimization program which includes automated exposure control, adjustment of the mA and/or kV according to patient size and/or use of iterative reconstruction technique. COMPARISON:  10/14/2015 FINDINGS: Brain: Evaluation is slightly limited by patient motion during the exam. Chronic right basal ganglia lacunar infarct again noted. There are chronic small-vessel ischemic changes throughout the periventricular white matter.  No evidence of acute infarct or hemorrhage. The lateral ventricles and remaining midline structures are otherwise unremarkable. No acute extra-axial fluid collections. No mass effect. Vascular: No hyperdense vessel or unexpected calcification. Skull: Normal. Negative for fracture or focal lesion. Sinuses/Orbits: No acute finding. Other: None. IMPRESSION: 1. Stable head CT, no acute  intracranial process. Electronically Signed   By: Randa Ngo M.D.   On: 05/18/2021 22:39   CT Abdomen Pelvis W Contrast  Result Date: 05/18/2021 CLINICAL DATA:  Evaluate for bowel obstruction.  Multiple falls. EXAM: CT ABDOMEN AND PELVIS WITH CONTRAST TECHNIQUE: Multidetector CT imaging of the abdomen and pelvis was performed using the standard protocol following bolus administration of intravenous contrast. RADIATION DOSE REDUCTION: This exam was performed according to the departmental dose-optimization program which includes automated exposure control, adjustment of the mA and/or kV according to patient size and/or use of iterative reconstruction technique. CONTRAST:  29mL OMNIPAQUE IOHEXOL 300 MG/ML  SOLN COMPARISON:  None. FINDINGS: Lower chest: The heart is enlarged.  Lung bases are grossly clear. Hepatobiliary: There is a homogeneously enhancing lesion in the left lobe of the liver measuring 1.4 x 1.9 cm. This is isodense to the liver on delayed imaging. No other liver lesions are identified. Gallbladder is within normal limits. No biliary ductal dilatation. Pancreas: Limited evaluation secondary to motion artifact. No definite acute or focal abnormality. Spleen: Normal in size without focal abnormality. Adrenals/Urinary Tract: Limited evaluation secondary to motion artifact. No hydronephrosis or perinephric fluid. Adrenal glands and bladder are within normal limits. Stomach/Bowel: No dilated bowel loops identified. There is colonic diverticulosis without evidence for diverticulitis. The appendix is not seen. The stomach is within normal limits. There is a large amount of stool dilating the rectum. Rectum measures 7.4 cm in diameter. Vascular/Lymphatic: Aortic atherosclerosis. No enlarged abdominal or pelvic lymph nodes. Reproductive: There is a 1.7 cm right adnexal cyst. Left ovary not well seen. Uterus appears atrophic. Other: There is no ascites or free air. There is a large ventral hernia containing  portions of the stomach, pancreas, large bowel and small bowel. Wall defect measures 4.8 by 6.3 cm. Hernia sac is left of midline on this study measuring 14.5 x 7 by 14.8 cm. There is no edema in the hernia sac. Musculoskeletal: Bones are diffusely osteopenic. Multilevel degenerative changes affect the spine. No acute fractures are seen. There is intramuscular hyperdensity in surrounding subcutaneous stranding in the inferior right gluteus muscle, incompletely imaged. IMPRESSION: 1. Large ventral hernia containing colon, small bowel, pancreas and stomach. No evidence for bowel obstruction. 2. Right gluteal musculature hyperdensity with surrounding edema may represent hematoma. Please correlate clinically. Differential diagnosis would include infection or other soft tissue mass. 3. The rectum is dilated and stool-filled. 4. Sigmoid colon diverticulosis. 5. 1.7 cm right adnexal simple cysts. Note: This recommendation does not apply to premenarchal patients and to those with increased risk (genetic, family history, elevated tumor markers or other high-risk factors) of ovarian cancer. Reference: JACR 2020 Feb; 17(2):248-254 6. Enhancing liver lesion measuring 1.9 cm. Findings may represent a flash fill hemangioma, but are indeterminate. Recommend further characterization with MRI. 7.  Aortic Atherosclerosis (ICD10-I70.0). Electronically Signed   By: Ronney Asters M.D.   On: 05/18/2021 22:40   EEG adult  Result Date: 05/19/2021 Lora Havens, MD     05/19/2021 12:28 PM Patient Name: ONESHIA UBALDO MRN: [id number] Epilepsy Attending: Lora Havens Referring Physician/Provider: Rolla Plate, DO Date: 05/19/2021 Duration: 23.45 mins Patient history: 88yo F with ams.  EEG to evaluate for seizure Level of alertness: Awake, asleep AEDs during EEG study: None Technical aspects: This EEG study was done with scalp electrodes positioned according to the 10-20 International system of electrode placement. Electrical  activity was acquired at a sampling rate of 500Hz  and reviewed with a high frequency filter of 70Hz  and a low frequency filter of 1Hz . EEG data were recorded continuously and digitally stored. Description: The posterior dominant rhythm consists of 10 Hz activity of moderate voltage (25-35 uV) seen predominantly in posterior head regions, symmetric and reactive to eye opening and eye closing. Sleep was characterized by vertex waves, sleep spindles (12 to 14 Hz), maximal frontocentral region. Hyperventilation did not show any EEG change.  Physiologic photic driving was not seen during photic stimulation.  IMPRESSION: This study is within normal limits. No seizures or epileptiform discharges were seen throughout the recording. Lora Havens    Microbiology: Results for orders placed or performed during the hospital encounter of 05/18/21  Resp Panel by RT-PCR (Flu A&B, Covid) Nasopharyngeal Swab     Status: None   Collection Time: 05/18/21  7:38 PM   Specimen: Nasopharyngeal Swab; Nasopharyngeal(NP) swabs in vial transport medium  Result Value Ref Range Status   SARS Coronavirus 2 by RT PCR NEGATIVE NEGATIVE Final    Comment: (NOTE) SARS-CoV-2 target nucleic acids are NOT DETECTED.  The SARS-CoV-2 RNA is generally detectable in upper respiratory specimens during the acute phase of infection. The lowest concentration of SARS-CoV-2 viral copies this assay can detect is 138 copies/mL. A negative result does not preclude SARS-Cov-2 infection and should not be used as the sole basis for treatment or other patient management decisions. A negative result may occur with  improper specimen collection/handling, submission of specimen other than nasopharyngeal swab, presence of viral mutation(s) within the areas targeted by this assay, and inadequate number of viral copies(<138 copies/mL). A negative result must be combined with clinical observations, patient history, and epidemiological information. The  expected result is Negative.  Fact Sheet for Patients:  EntrepreneurPulse.com.au  Fact Sheet for Healthcare Providers:  IncredibleEmployment.be  This test is no t yet approved or cleared by the [place] FDA and  has been authorized for detection and/or diagnosis of SARS-CoV-2 by FDA under an Emergency Use Authorization (EUA). This EUA will remain  in effect (meaning this test can be used) for the duration of the COVID-19 declaration under Section 564(b)(1) of the Act, 21 U.S.C.section 360bbb-3(b)(1), unless the authorization is terminated  or revoked sooner.       Influenza A by PCR NEGATIVE NEGATIVE Final   Influenza B by PCR NEGATIVE NEGATIVE Final    Comment: (NOTE) The Xpert Xpress SARS-CoV-2/FLU/RSV plus assay is intended as an aid in the diagnosis of influenza from Nasopharyngeal swab specimens and should not be used as a sole basis for treatment. Nasal washings and aspirates are unacceptable for Xpert Xpress SARS-CoV-2/FLU/RSV testing.  Fact Sheet for Patients: EntrepreneurPulse.com.au  Fact Sheet for Healthcare Providers: IncredibleEmployment.be  This test is not yet approved or cleared by the [place] FDA and has been authorized for detection and/or diagnosis of SARS-CoV-2 by FDA under an Emergency Use Authorization (EUA). This EUA will remain in effect (meaning this test can be used) for the duration of the COVID-19 declaration under Section 564(b)(1) of the Act, 21 U.S.C. section 360bbb-3(b)(1), unless the authorization is terminated or revoked.  Performed at [facility], [address] [place], [place] [address]   Urine Culture  Status: Abnormal   Collection Time: 05/18/21  8:07 PM   Specimen: Urine, Catheterized  Result Value Ref Range Status   Specimen Description   Final    URINE, CATHETERIZED Performed at [facility], [address] [place], [place] [address]     Special Requests   Final    NONE Performed at [facility], [address], [place], [place] [address]    Culture >=100,000 COLONIES/mL STAPHYLOCOCCUS HOMINIS (A)  Final   Report Status 05/21/2021 FINAL  Final   Organism ID, Bacteria STAPHYLOCOCCUS HOMINIS (A)  Final      Susceptibility   Staphylococcus hominis - MIC*    CIPROFLOXACIN <=0.5 SENSITIVE Sensitive     GENTAMICIN <=0.5 SENSITIVE Sensitive     NITROFURANTOIN <=16 SENSITIVE Sensitive     OXACILLIN >=4 RESISTANT Resistant     TETRACYCLINE 2 SENSITIVE Sensitive     VANCOMYCIN 1 SENSITIVE Sensitive     TRIMETH/SULFA <=10 SENSITIVE Sensitive     CLINDAMYCIN <=0.25 SENSITIVE Sensitive     RIFAMPIN <=0.5 SENSITIVE Sensitive     Inducible Clindamycin NEGATIVE Sensitive     * >=100,000 COLONIES/mL STAPHYLOCOCCUS HOMINIS  MRSA Next Gen by PCR, Nasal     Status: None   Collection Time: 05/19/21 12:30 PM   Specimen: Nasal Mucosa; Nasal Swab  Result Value Ref Range Status   MRSA by PCR Next Gen NOT DETECTED NOT DETECTED Final    Comment: (NOTE) The GeneXpert MRSA Assay (FDA approved for NASAL specimens only), is one component of a comprehensive MRSA colonization surveillance program. It is not intended to diagnose MRSA infection nor to guide or monitor treatment for MRSA infections. Test performance is not FDA approved in patients less than 106 years old. Performed at [facility], [address] [place],  [address]     Labs: CBC: No results for input(s): WBC, NEUTROABS, HGB, HCT, MCV, PLT in the last 168 hours. Basic Metabolic Panel: No results for input(s): NA, K, CL, CO2, GLUCOSE, BUN, CREATININE, CALCIUM, MG, PHOS in the last 168 hours. Liver Function Tests: No results for input(s): AST, ALT, ALKPHOS, BILITOT, PROT, ALBUMIN in the last 168 hours. CBG: No results for input(s): GLUCAP in the last 168 hours.  Discharge time spent: greater than 30 minutes.  Signed: Deatra James, MD [facility] 05/30/2021

## 2021-06-14 ENCOUNTER — Encounter (HOSPITAL_COMMUNITY): Payer: Self-pay | Admitting: Radiology

## 2021-06-15 DEATH — deceased

## 2021-07-13 ENCOUNTER — Ambulatory Visit: Payer: Medicare HMO | Admitting: Cardiovascular Disease

## 2022-05-02 IMAGING — CT CT HEAD W/O CM
3 series · 15 of 47 positions shown, 18 images · non-contrast
Comparison: 10/14/2015

CLINICAL DATA: Altered level of consciousness, multiple falls



[Series 2: head w o · axial · 0.39mm/px · z∈[+214,+344]mm · 9 of 32 slices shown, 12 images]
[im 3/32  brain]
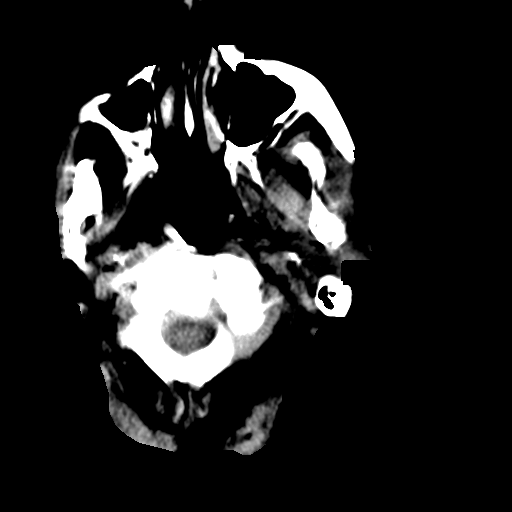
[im 3/32  bone]
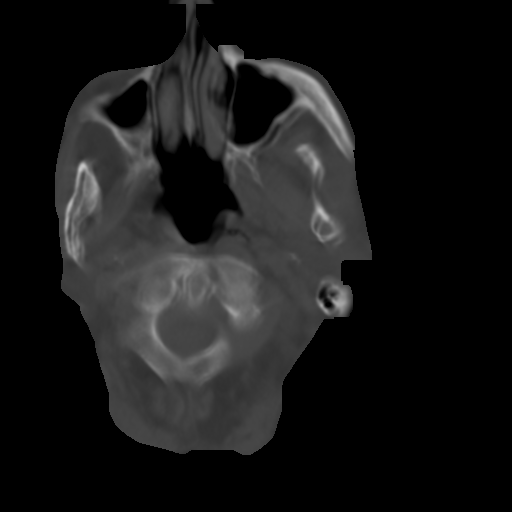
[im 6/32  brain]
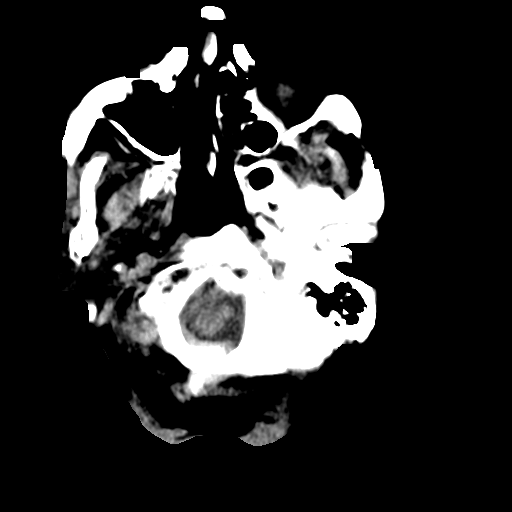
[im 9/32  brain]
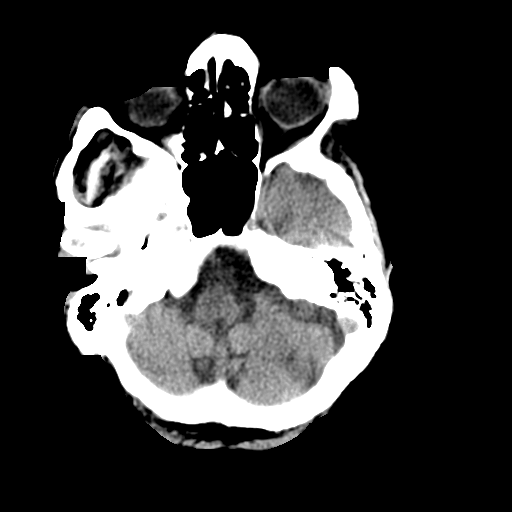
[im 12/32  brain]
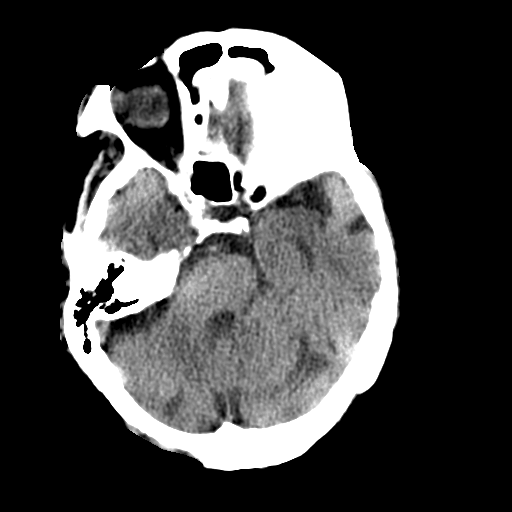
[im 17/32  brain]
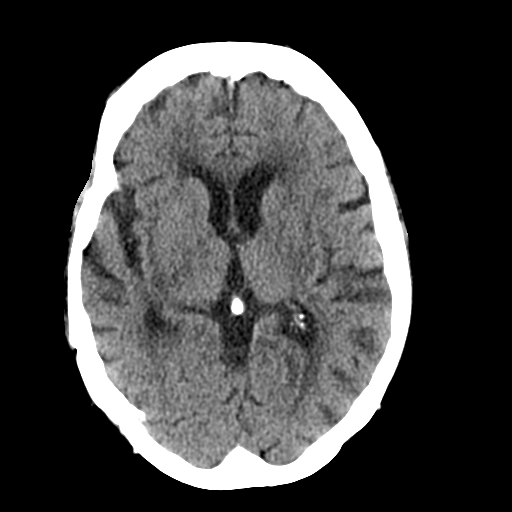
[im 17/32  bone]
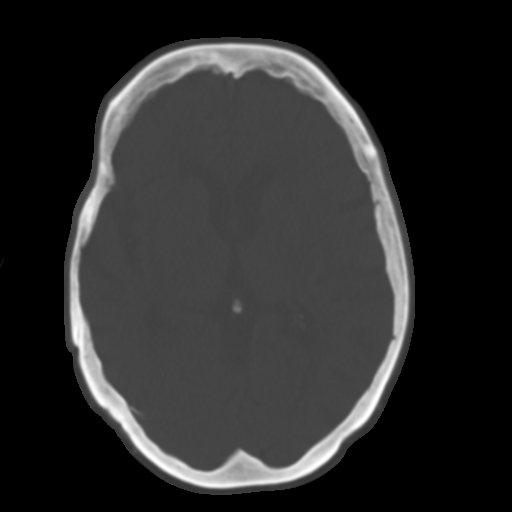
[im 20/32  brain]
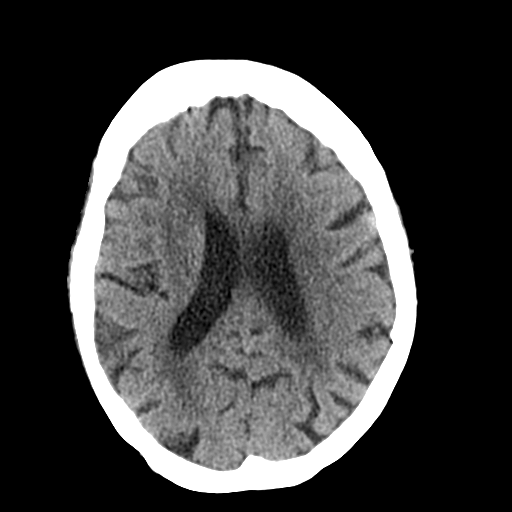
[im 23/32  brain]
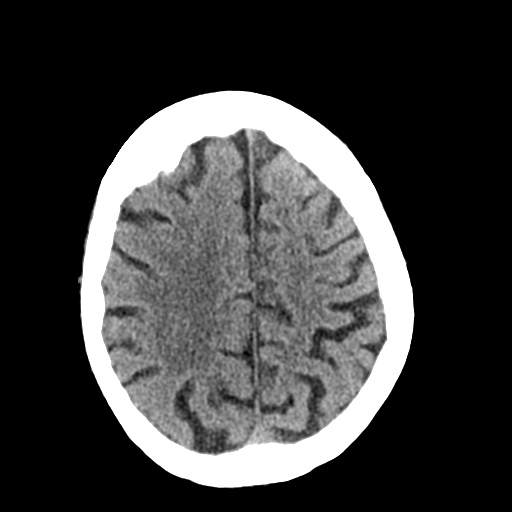
[im 26/32  brain]
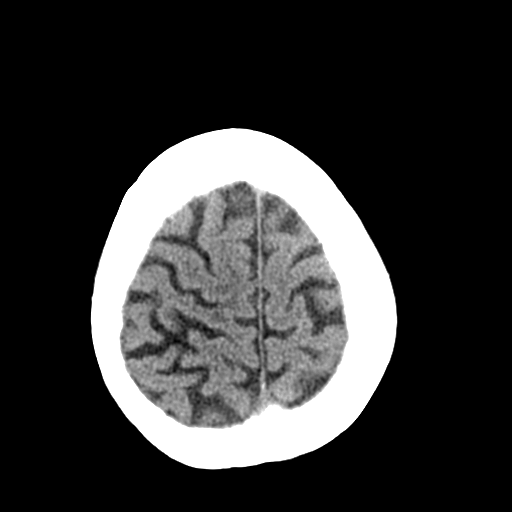
[im 29/32  brain]
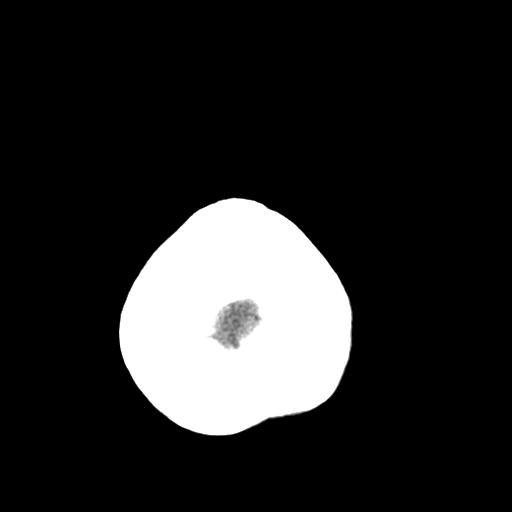
[im 29/32  bone]
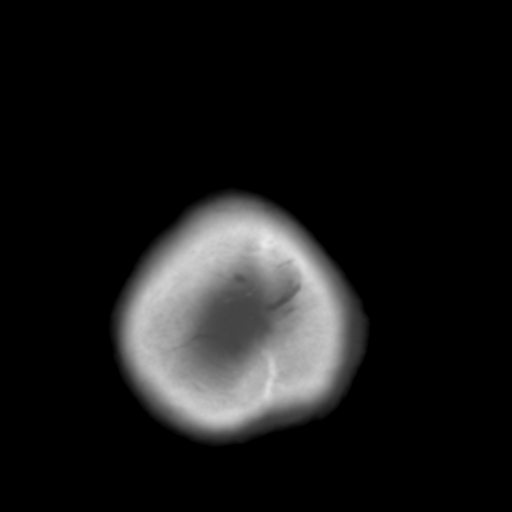

[Series 4: coronal soft · coronal · 0.30mm/px · 3 of 63 slices shown]
[im 21/63  brain]
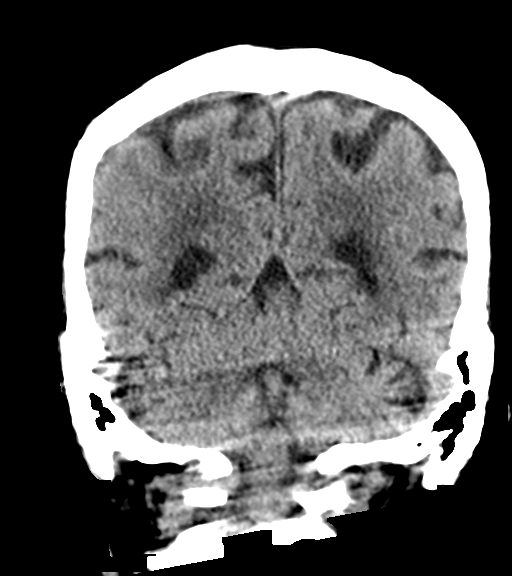
[im 28/63  brain]
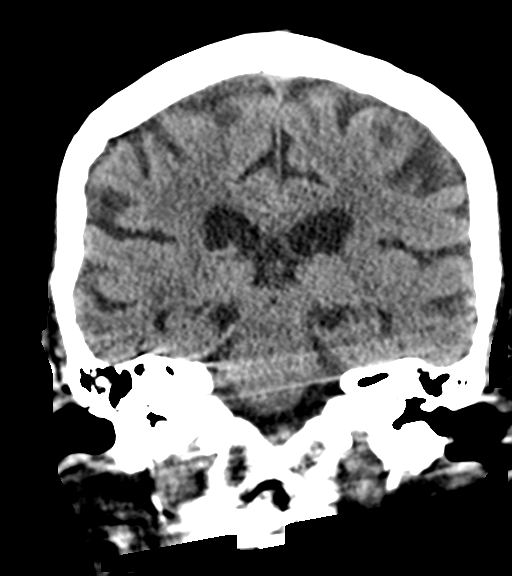
[im 35/63  brain]
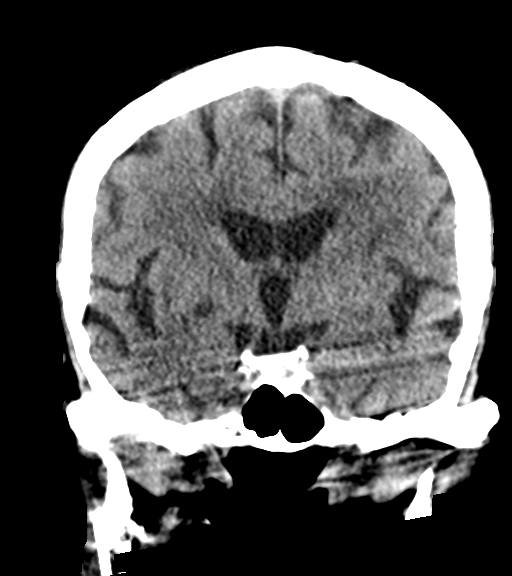

[Series 5: sagittal soft · sagittal · 0.36mm/px · 3 of 52 slices shown]
[im 18/52  brain]
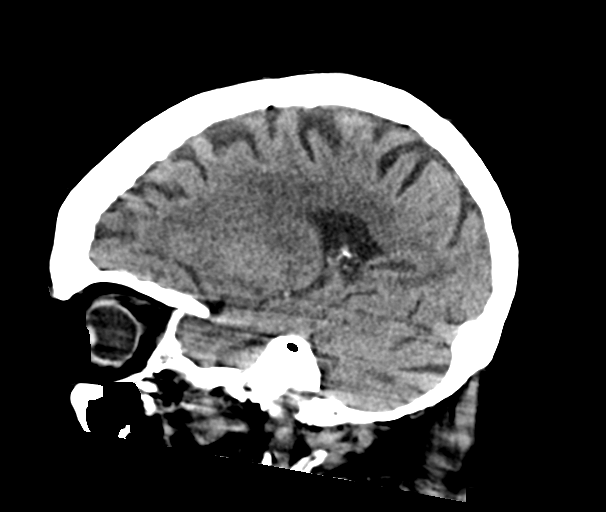
[im 26/52  brain]
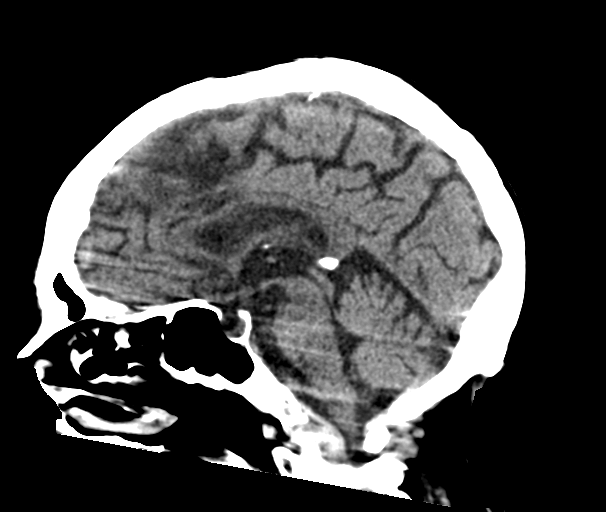
[im 35/52  brain]
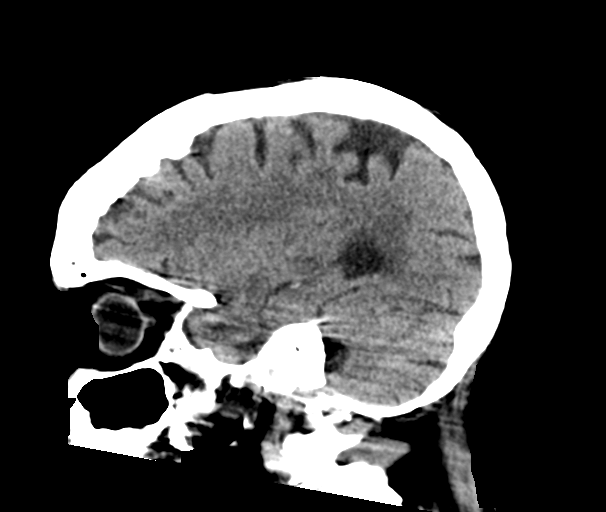

[15 of 47 positions shown; findings below may reference images not displayed]

FINDINGS: Brain: Evaluation is slightly limited by patient motion during the
exam. Chronic right basal ganglia lacunar infarct again noted. There
are chronic small-vessel ischemic changes throughout the
periventricular white matter. No evidence of acute infarct or
hemorrhage. The lateral ventricles and remaining midline structures
are otherwise unremarkable. No acute extra-axial fluid collections.
No mass effect.

Vascular: No hyperdense vessel or unexpected calcification.

Skull: Normal. Negative for fracture or focal lesion.

Sinuses/Orbits: No acute finding.

Other: None.
IMPRESSION: 1. Stable head CT, no acute intracranial process.
